# Patient Record
Sex: Female | Born: 1935 | Race: White | Hispanic: No | Smoking: Former smoker
Health system: Southern US, Community
[De-identification: ages and names within clinical notes are randomized; demographics above are authoritative.]

## PROBLEM LIST (undated history)

## (undated) HISTORY — PX: CATARACT EXTRACTION, BILATERAL: SHX1313

## (undated) HISTORY — PX: ABDOMINAL HYSTERECTOMY: SHX81

---

## 2014-10-04 DIAGNOSIS — I10 Essential (primary) hypertension: Secondary | ICD-10-CM | POA: Insufficient documentation

## 2014-10-04 DIAGNOSIS — H903 Sensorineural hearing loss, bilateral: Secondary | ICD-10-CM | POA: Insufficient documentation

## 2016-05-13 DIAGNOSIS — H34831 Tributary (branch) retinal vein occlusion, right eye, with macular edema: Secondary | ICD-10-CM | POA: Insufficient documentation

## 2017-01-06 DIAGNOSIS — H35351 Cystoid macular degeneration, right eye: Secondary | ICD-10-CM | POA: Insufficient documentation

## 2017-04-09 DIAGNOSIS — N183 Chronic kidney disease, stage 3 unspecified: Secondary | ICD-10-CM | POA: Insufficient documentation

## 2017-04-26 ENCOUNTER — Ambulatory Visit
Admission: RE | Admit: 2017-04-26 | Discharge: 2017-04-26 | Disposition: A | Discharge status: Home / Self Care | Payer: Medicare Other | Source: Ambulatory Visit | Attending: Oncology | Admitting: Oncology

## 2017-04-26 ENCOUNTER — Inpatient Hospital Stay: Payer: Medicare Other | Attending: Oncology | Admitting: Oncology

## 2017-04-26 ENCOUNTER — Other Ambulatory Visit: Payer: Self-pay

## 2017-04-26 ENCOUNTER — Inpatient Hospital Stay: Payer: Medicare Other

## 2017-04-26 ENCOUNTER — Encounter: Payer: Self-pay | Admitting: Oncology

## 2017-04-26 VITALS — BP 111/71 | HR 66 | Temp 97.5°F | Resp 20 | Wt 141.8 lb

## 2017-04-26 DIAGNOSIS — Z79899 Other long term (current) drug therapy: Secondary | ICD-10-CM | POA: Insufficient documentation

## 2017-04-26 DIAGNOSIS — D751 Secondary polycythemia: Secondary | ICD-10-CM

## 2017-04-26 DIAGNOSIS — Z8701 Personal history of pneumonia (recurrent): Secondary | ICD-10-CM | POA: Diagnosis not present

## 2017-04-26 DIAGNOSIS — M8580 Other specified disorders of bone density and structure, unspecified site: Secondary | ICD-10-CM | POA: Insufficient documentation

## 2017-04-26 DIAGNOSIS — E119 Type 2 diabetes mellitus without complications: Secondary | ICD-10-CM | POA: Diagnosis not present

## 2017-04-26 DIAGNOSIS — Z87891 Personal history of nicotine dependence: Secondary | ICD-10-CM | POA: Insufficient documentation

## 2017-04-26 DIAGNOSIS — I10 Essential (primary) hypertension: Secondary | ICD-10-CM | POA: Insufficient documentation

## 2017-04-26 DIAGNOSIS — Z9842 Cataract extraction status, left eye: Secondary | ICD-10-CM | POA: Insufficient documentation

## 2017-04-26 DIAGNOSIS — I700 Atherosclerosis of aorta: Secondary | ICD-10-CM | POA: Insufficient documentation

## 2017-04-26 DIAGNOSIS — R3129 Other microscopic hematuria: Secondary | ICD-10-CM | POA: Diagnosis not present

## 2017-04-26 LAB — CBC WITH DIFFERENTIAL/PLATELET
Basophils Absolute: 0.1 10*3/uL (ref 0–0.1)
Basophils Relative: 1 %
EOS ABS: 0.3 10*3/uL (ref 0–0.7)
EOS PCT: 3 %
HCT: 55.8 % — ABNORMAL HIGH (ref 35.0–47.0)
HEMOGLOBIN: 18.8 g/dL — AB (ref 12.0–16.0)
LYMPHS ABS: 2.1 10*3/uL (ref 1.0–3.6)
Lymphocytes Relative: 25 %
MCH: 32.7 pg (ref 26.0–34.0)
MCHC: 33.6 g/dL (ref 32.0–36.0)
MCV: 97.5 fL (ref 80.0–100.0)
MONO ABS: 0.5 10*3/uL (ref 0.2–0.9)
Monocytes Relative: 6 %
NEUTROS PCT: 65 %
Neutro Abs: 5.5 10*3/uL (ref 1.4–6.5)
PLATELETS: 245 10*3/uL (ref 150–440)
RBC: 5.73 MIL/uL — ABNORMAL HIGH (ref 3.80–5.20)
RDW: 13 % (ref 11.5–14.5)
WBC: 8.5 10*3/uL (ref 3.6–11.0)

## 2017-04-26 LAB — URINALYSIS, COMPLETE (UACMP) WITH MICROSCOPIC
Bilirubin Urine: NEGATIVE
GLUCOSE, UA: NEGATIVE mg/dL
Ketones, ur: NEGATIVE mg/dL
NITRITE: NEGATIVE
PH: 5.5 (ref 5.0–8.0)
Protein, ur: NEGATIVE mg/dL
SPECIFIC GRAVITY, URINE: 1.015 (ref 1.005–1.030)

## 2017-04-26 NOTE — Progress Notes (Signed)
Hematology/Oncology Consult note [facility] Telephone:([phone] [phone]  Patient Care Team: Orene Desanctis, MD as PCP - General (Pediatrics)   Name of the patient: Kelsey Russell  [id number]  08/25/1922    Reason for referral- high hemoglobin   Referring physician- Dr. Richardine Service  Date of visit: 04/26/17   History of presenting illness-patient-year-old female with a past medical history significant for hypertension diabetes, chronic issues who recently underwent CBC which showed white count of 8.3, H&H of 18.3/53.8 and a platelet count of 239.  CBC prior to that in 2016 showed a hemoglobin of 14.  We do not have any CBCs between 2016-2019 for comparison.  BMP in January 2019 was significant for creatinine of 1.7.  Liver function tests were normal  Patient lives with her daughter and is independent of her ADLs.  Daughter reports that patient has been feeling more fatigued and her appetite has gone down over the last 1 month.  She sleeps about 15-16 hours a day.  Reports no problems sleeping and she does wake up in the morning feeling refreshed.  She did smoke in the past but quit smoking in 1968.  She has 2-3 drinks every day  ECOG PS- 1  Pain scale- 0   Review of systems- Review of Systems  Constitutional: Negative for chills, fever, malaise/fatigue and weight loss.  HENT: Negative for congestion, ear discharge and nosebleeds.   Eyes: Negative for blurred vision.  Respiratory: Negative for cough, hemoptysis, sputum production, shortness of breath and wheezing.   Cardiovascular: Negative for chest pain, palpitations, orthopnea and claudication.  Gastrointestinal: Negative for abdominal pain, blood in stool, constipation, diarrhea, heartburn, melena, nausea and vomiting.  Genitourinary: Negative for dysuria, flank pain, frequency, hematuria and urgency.  Musculoskeletal: Negative for back pain, joint pain and myalgias.  Skin: Negative for rash.    Neurological: Negative for dizziness, tingling, focal weakness, seizures, weakness and headaches.  Endo/Heme/Allergies: Does not bruise/bleed easily.  Psychiatric/Behavioral: Negative for depression and suicidal ideas. The patient does not have insomnia.     Allergies  Allergen Reactions  . Levaquin [Levofloxacin In D5w]   . Statins     There are no active problems to display for this patient.    History reviewed. No pertinent past medical history.   Past Surgical History:  Procedure Laterality Date  . ABDOMINAL HYSTERECTOMY    . CATARACT EXTRACTION, BILATERAL      Social History   Socioeconomic History  . Marital status: Widowed    Spouse name: Not on file  . Number of children: Not on file  . Years of education: Not on file  . Highest education level: Not on file  Social Needs  . Financial resource strain: Not on file  . Food insecurity - worry: Not on file  . Food insecurity - inability: Not on file  . Transportation needs - medical: Not on file  . Transportation needs - non-medical: Not on file  Occupational History  . Not on file  Tobacco Use  . Smoking status: Former Smoker    Last attempt to quit: 04/26/1966    Years since quitting: 51.0  . Smokeless tobacco: Never Used  Substance and Sexual Activity  . Alcohol use: Yes    Alcohol/week: 10.8 oz    Types: 18 Shots of liquor per week    Comment: 2-3 shots scotch daily  . Drug use: Not on file  . Sexual activity: Not on file  Other Topics Concern  . Not on  file  Social History Narrative  . Not on file     Family History  Problem Relation Age of Onset  . Cancer Sister   . Cancer Brother   . Cancer Sister   . Cancer Son   . Cancer Brother      Current Outpatient Medications:  .  aspirin EC 81 MG tablet, Take 81 mg by mouth daily., Disp: , Rfl:  .  hydrochlorothiazide (HYDRODIURIL) 25 MG tablet, Take 25 mg by mouth daily., Disp: , Rfl:  .  irbesartan (AVAPRO) 300 MG tablet, Take 150 mg by mouth  daily., Disp: , Rfl:  .  mupirocin ointment (BACTROBAN) 2 %, Apply 1 application topically 2 (two) times daily., Disp: , Rfl:  .  naproxen sodium (ALEVE) 220 MG tablet, Take 220 mg by mouth daily as needed., Disp: , Rfl:    Physical exam:  Vitals:   04/26/17 1113  BP: 111/71  Pulse: 66  Resp: 20  Temp: (!) 97.5 F (36.4 C)  TempSrc: Tympanic  Weight: 141 lb 12.1 oz (64.3 kg)  She is saturating 97% on room air   Physical Exam  Constitutional: She is oriented to person, place, and time.  Frail elderly female in no acute distress  HENT:  Head: Normocephalic and atraumatic.  Eyes: EOM are normal. Pupils are equal, round, and reactive to light.  Neck: Normal range of motion.  Cardiovascular: Normal rate, regular rhythm and normal heart sounds.  Pulmonary/Chest: Effort normal and breath sounds normal.  Abdominal: Soft. Bowel sounds are normal.  No palpable splenomegaly  Neurological: She is alert and oriented to person, place, and time.  Skin: Skin is warm and dry.     Assessment and plan- Patient is a 84 y.o. female referred for polycythemia  Discussed primary polycythemia vera versus secondary polycythemia with patient and her daughter.  I will check CBC with differential, EPO level, Jak 2 mutation testing, chest x-ray and urinalysis today.  I will see the patient back in 1 week's time to discuss the results of her blood work.  If she has evidence of polycythemia vera-she will need phlebotomy to keep her hematocrit less than 45 along with hydroxyurea.  If she has secondary polycythemia we could consider watchful waiting and considering phlebotomy if her hematocrit is greater than 55.   Thank you for this kind referral and the opportunity to participate in the care of this patient   Visit Diagnosis 1. Polycythemia     Dr. Owens SharkArchana Cynithia Hakimi, MD, MPH [facility] Pager- [phone] 04/26/2017  11:47 AM

## 2017-04-27 LAB — ERYTHROPOIETIN: Erythropoietin: 4.1 m[IU]/mL (ref 2.6–18.5)

## 2017-05-03 ENCOUNTER — Inpatient Hospital Stay: Payer: Medicare Other

## 2017-05-03 ENCOUNTER — Encounter: Payer: Self-pay | Admitting: Oncology

## 2017-05-03 ENCOUNTER — Inpatient Hospital Stay (HOSPITAL_BASED_OUTPATIENT_CLINIC_OR_DEPARTMENT_OTHER): Payer: Medicare Other | Admitting: Oncology

## 2017-05-03 VITALS — BP 102/67 | HR 60 | Resp 18

## 2017-05-03 DIAGNOSIS — M8580 Other specified disorders of bone density and structure, unspecified site: Secondary | ICD-10-CM | POA: Diagnosis not present

## 2017-05-03 DIAGNOSIS — Z87891 Personal history of nicotine dependence: Secondary | ICD-10-CM | POA: Diagnosis not present

## 2017-05-03 DIAGNOSIS — Z79899 Other long term (current) drug therapy: Secondary | ICD-10-CM

## 2017-05-03 DIAGNOSIS — D751 Secondary polycythemia: Secondary | ICD-10-CM | POA: Diagnosis not present

## 2017-05-03 LAB — JAK2 GENOTYPR

## 2017-05-03 MED ORDER — SODIUM CHLORIDE 0.9 % IV SOLN
INTRAVENOUS | Status: DC
  Administered 2017-05-03: 12:00:00 via INTRAVENOUS
  Filled 2017-05-03: qty 1000

## 2017-05-03 NOTE — Progress Notes (Signed)
Hematology/Oncology Consult note [facility]  Telephone:([phone]) [phone] [phone]  Patient Care Team: Orene DesanctisBehling, Karen, MD as PCP - General (Pediatrics)   Name of the patient: Kelsey Russell  [id number]  04/06/1922   Date of visit: 05/03/17  Diagnosis- polycythemia- primary versus secondary (work up pending)  Automotive engineerChief complaint/ Reason for visit- discuss results of bloodwork  Heme/Onc history: patient-year-old female with a past medical history significant for hypertension diabetes, chronic issues who recently underwent CBC which showed white count of 8.3, H&H of 18.3/53.8 and a platelet count of 239.  CBC prior to that in 2016 showed a hemoglobin of 14.  We do not have any CBCs between 2016-2019 for comparison.  BMP in January 2019 was significant for creatinine of 1.7.  Liver function tests were normal  Patient lives with her daughter and is independent of her ADLs.  Daughter reports that patient has been feeling more fatigued and her appetite has gone down over the last 1 month.  She sleeps about 15-16 hours a day.  Reports no problems sleeping and she does wake up in the morning feeling refreshed.  She did smoke in the past but quit smoking in 1968.  She has 2-3 drinks every day  CBC from 04/26/2090 right count of 8.5, H&H of 18.8/55.8 with a platelet count of 245.  EPO levels were low normal at 4.1.  Jak 2 mutation testing is currently pending.  Urinalysis showed microscopic hematuria. CXR showed no chronic lung disease   Interval history- feels well. Denies any chest pain, stroke like symptoms or fatigue  ECOG PS- 1-2 Pain scale- 0   Review of systems- Review of Systems  Constitutional: Positive for malaise/fatigue. Negative for chills, fever and weight loss.  HENT: Negative for congestion, ear discharge and nosebleeds.   Eyes: Negative for blurred vision.  Respiratory: Negative for cough, hemoptysis, sputum production, shortness of breath and  wheezing.   Cardiovascular: Negative for chest pain, palpitations, orthopnea and claudication.  Gastrointestinal: Negative for abdominal pain, blood in stool, constipation, diarrhea, heartburn, melena, nausea and vomiting.  Genitourinary: Negative for dysuria, flank pain, frequency, hematuria and urgency.  Musculoskeletal: Negative for back pain, joint pain and myalgias.  Skin: Negative for rash.  Neurological: Negative for dizziness, tingling, focal weakness, seizures, weakness and headaches.  Endo/Heme/Allergies: Does not bruise/bleed easily.  Psychiatric/Behavioral: Negative for depression and suicidal ideas. The patient does not have insomnia.       Allergies  Allergen Reactions  . Levaquin [Levofloxacin In D5w]   . Statins      History reviewed. No pertinent past medical history.   Past Surgical History:  Procedure Laterality Date  . ABDOMINAL HYSTERECTOMY    . CATARACT EXTRACTION, BILATERAL      Social History   Socioeconomic History  . Marital status: Widowed    Spouse name: Not on file  . Number of children: Not on file  . Years of education: Not on file  . Highest education level: Not on file  Social Needs  . Financial resource strain: Not on file  . Food insecurity - worry: Not on file  . Food insecurity - inability: Not on file  . Transportation needs - medical: Not on file  . Transportation needs - non-medical: Not on file  Occupational History  . Not on file  Tobacco Use  . Smoking status: Former Smoker    Last attempt to quit: 04/26/1966    Years since quitting: 51.0  . Smokeless tobacco: Never Used  Substance and Sexual Activity  . Alcohol use: Yes    Alcohol/week: 10.8 oz    Types: 18 Shots of liquor per week    Comment: 2-3 shots scotch daily  . Drug use: Not on file  . Sexual activity: Not on file  Other Topics Concern  . Not on file  Social History Narrative  . Not on file    Family History  Problem Relation Age of Onset  . Cancer  Sister   . Cancer Brother   . Cancer Sister   . Cancer Son   . Cancer Brother      Current Outpatient Medications:  .  aspirin EC 81 MG tablet, Take 81 mg by mouth daily., Disp: , Rfl:  .  hydrochlorothiazide (HYDRODIURIL) 25 MG tablet, Take 25 mg by mouth daily., Disp: , Rfl:  .  irbesartan (AVAPRO) 300 MG tablet, Take 150 mg by mouth daily., Disp: , Rfl:  .  mupirocin ointment (BACTROBAN) 2 %, Apply 1 application topically 2 (two) times daily., Disp: , Rfl:  .  naproxen sodium (ALEVE) 220 MG tablet, Take 220 mg by mouth daily as needed., Disp: , Rfl:  No current facility-administered medications for this visit.   Facility-Administered Medications Ordered in Other Visits:  .  0.9 %  sodium chloride infusion, , Intravenous, Weekly, Creig Hines, MD  Physical exam:  Vitals:   05/03/17 1113  BP: (!) (P) 111/57  Pulse: (P) 71  Resp: (P) 18  Temp: (P) 98 F (36.7 C)  TempSrc: (P) Tympanic  Weight: (P) 142 lb (64.4 kg)   Physical Exam  Constitutional: She is oriented to person, place, and time.  Frail elderly female in no acute distress  HENT:  Head: Normocephalic and atraumatic.  Eyes: EOM are normal. Pupils are equal, round, and reactive to light.  Neck: Normal range of motion.  Cardiovascular: Normal rate, regular rhythm and normal heart sounds.  Pulmonary/Chest: Effort normal and breath sounds normal.  Abdominal: Soft. Bowel sounds are normal.  Neurological: She is alert and oriented to person, place, and time.  Skin: Skin is warm and dry.     No flowsheet data found. CBC Latest Ref Rng & Units 04/26/2017  WBC 3.6 - 11.0 K/uL 8.5  Hemoglobin 12.0 - 16.0 g/dL 18.8(H)  Hematocrit 35.0 - 47.0 % 55.8(H)  Platelets 150 - 440 K/uL 245    No images are attached to the encounter.  Dg Chest 2 View  Result Date: 04/26/2017 CLINICAL DATA:  Polycythemia and former smoker with history of prior pneumonia and bronchitis. EXAM: CHEST  2 VIEW COMPARISON:  None. FINDINGS: The  heart size is normal. The thoracic aorta demonstrates atherosclerosis and tortuosity. There is some elevation of the right hemidiaphragm. There is no evidence of pulmonary edema, consolidation, pneumothorax, nodule or pleural fluid. The bony thorax shows osteopenia and degenerative disc disease throughout the thoracic spine. IMPRESSION: No significant chronic lung disease or acute findings. Thoracic aortic atherosclerosis present with tortuosity of the thoracic aorta. Electronically Signed   By: Irish Lack M.D.   On: 04/26/2017 13:46     Assessment and plan- Patient is a 84 y.o. female referred for polycthemia  JAK2 mutation testing is still pending. Low normal EPO levels and a normal hb in 2016 is concerning for primary process such as polycythemia vera. UA did show microscopic hematuria. Malignancies such as RCC can cause polycythemia although EPO levele should be high in these cases. I will obtain CT abdomen pelvis without contrast (given  abnormal creatinine) to r/o any malignancy  If this is primary P vera- goal Hct would be less than 45. For secondary polycythemia I would aim for a higher hematocrit between 55-60 especially given her age  I will proceed with 250 cc of phlebotomy today (replace with 250 cc normal saline). She will get another phlebotomy in 2 weeks. I will see her in 4 weeks time for phlebotomy #3. If her JAK2 mutation comes back positive, I will see her sooner to discuss hydrea  I also discussed focusing on her QOL and symptoms (she is asymptomatic at this time) instead of scheduled phlebotomies given her age. She would like to proceed with phlebotomy for now.   Visit Diagnosis 1. Polycythemia      Dr. Owens Shark, MD, MPH [facility] at [facility] Pager- [phone] 05/03/2017 12:21 PM

## 2017-05-06 ENCOUNTER — Other Ambulatory Visit: Payer: Self-pay | Admitting: *Deleted

## 2017-05-06 DIAGNOSIS — D751 Secondary polycythemia: Secondary | ICD-10-CM

## 2017-05-07 ENCOUNTER — Other Ambulatory Visit: Payer: Self-pay | Admitting: *Deleted

## 2017-05-07 DIAGNOSIS — D751 Secondary polycythemia: Secondary | ICD-10-CM

## 2017-05-10 ENCOUNTER — Encounter: Payer: Self-pay | Admitting: Pediatrics

## 2017-05-11 ENCOUNTER — Encounter: Payer: Self-pay | Admitting: *Deleted

## 2017-05-17 ENCOUNTER — Other Ambulatory Visit: Payer: Medicare Other

## 2017-05-26 ENCOUNTER — Other Ambulatory Visit: Payer: Medicare Other

## 2017-05-31 ENCOUNTER — Ambulatory Visit: Payer: Medicare Other | Admitting: Oncology

## 2017-05-31 ENCOUNTER — Other Ambulatory Visit: Payer: Medicare Other

## 2018-06-01 DIAGNOSIS — F0390 Unspecified dementia without behavioral disturbance: Secondary | ICD-10-CM | POA: Insufficient documentation

## 2018-06-08 ENCOUNTER — Telehealth: Payer: Self-pay | Admitting: Primary Care

## 2018-06-08 NOTE — Telephone Encounter (Signed)
Called to set up community palliative medicine  visit for ongoing care. Message left.

## 2018-06-08 NOTE — Telephone Encounter (Signed)
T/c to MD to establish ongoing palliative care consultation. Office advised to continue to see patient in community prn.

## 2018-07-13 ENCOUNTER — Telehealth: Payer: Self-pay | Admitting: Primary Care

## 2018-07-14 ENCOUNTER — Other Ambulatory Visit: Payer: Medicare Other | Admitting: Primary Care

## 2018-07-14 ENCOUNTER — Other Ambulatory Visit: Payer: Self-pay

## 2018-07-15 ENCOUNTER — Other Ambulatory Visit: Payer: Self-pay

## 2018-07-15 ENCOUNTER — Other Ambulatory Visit: Payer: Medicare Other | Admitting: Primary Care

## 2018-07-15 DIAGNOSIS — Z515 Encounter for palliative care: Secondary | ICD-10-CM

## 2018-07-15 DIAGNOSIS — H903 Sensorineural hearing loss, bilateral: Secondary | ICD-10-CM

## 2018-07-15 DIAGNOSIS — I10 Essential (primary) hypertension: Secondary | ICD-10-CM

## 2018-07-15 DIAGNOSIS — F0390 Unspecified dementia without behavioral disturbance: Secondary | ICD-10-CM

## 2018-07-15 NOTE — Progress Notes (Signed)
[facility] Pall[facility] Care Consult Note Telephone: [phone]  Fax: [phone]  TELEHEALTH VISIT STATEMENT Due to the COVID-19 crisis, this visit was done via telemedicine from my office and it was initiated and consent by this patient and or family.   PATIENT NAME: Kelsey Russell DOB: February 22, 1923 MRN: [id number]  PRIMARY CARE PROVIDER:   Barbaraann Boys, MD  REFERRING PROVIDER:  Barbaraann Russell, [address] [place], [place] [address]  RESPONSIBLE PARTY:  Extended Emergency Contact Information Primary Emergency Contact: Kelsey, Russell Mobile Russell: [phone] Relation: Daughter Secondary Emergency Contact: Kelsey Russell: [phone] Relation: Daughter  Palliative Medicine was asked to consult on this case by Dr. Janene Russell. This is a follow up visit. Patient and daughter Kelsey Russell were present by telemedicine visit.   ASSESSMENT and RECOMMENDATIONS:   1. Goals of care: Patient has DNR. Will complete MOST form.Daughter and POA Kelsey Russell states she is not sure what to do if her mom passes away in her sleep. We discussed the MOST form and how this goes into more specific medical choices, and how EMS can take the completed MOST as a care order. Kelsey Russell is much relieved to know this exists. Will send a MOST form to her by mail for her records. Will f/u with [facility] when able to meet in person for co signature.  2. Caregiving: No needs voiced at this time. Kelsey Russell is primary care giver and states they are doing well. They are both confined during the Covid epidemic, and go out weekly for [employer] drive through.   3. Life Review: Patient told me about packing up her things at age 45 and moving to [place] to work in [place]. She met her husband there. She was 11th of 12 children, grew up on a farm, and is the only one remaining of her siblings and siblings in Sports [employer]. She lives with her daughter and a small dog she enjoys. She states she  is a gypsy at heart and out lines her "three golden rules"  for living. She denies any concerns with medications or symptoms at this time.  Palliative care will continue to follow for goals of care clarification and f/u in 2 months or prn.  I spent 40 minutes providing this consultation,  from 1045 to 1125. More than 50% of the time in this consultation was spent coordinating communication.   HISTORY OF PRESENT ILLNESS:  Kelsey Russell is a 85 y.o. year old female with multiple medical problems including Dementia, Insomnia, CKD III, Macular degeneration, HTN, H/o DM, now controlled. Palliative Care was asked to help address goals of care.   CODE STATUS: DNR  PPS: 40% HOSPICE ELIGIBILITY/DIAGNOSIS: TBD  PAST MEDICAL HISTORY: Dementia, Insomnia, CKD III, Macular degeneration, HTN, H/o DM, now controlled.  SOCIAL HX:  Social History   Tobacco Use  . Smoking status: Former Smoker    Last attempt to quit: 04/26/1966    Years since quitting: 52.2  . Smokeless tobacco: Never Used  Substance Use Topics  . Alcohol use: Yes    Alcohol/week: 18.0 standard drinks    Types: 18 Shots of liquor per week    Comment: 2-3 shots scotch daily    ALLERGIES:  Allergies  Allergen Reactions  . Levaquin [Levofloxacin In D5w]   . Statins      PERTINENT MEDICATIONS:  Outpatient Encounter Medications as of 07/15/2018  Medication Sig  . aspirin EC 81 MG tablet Take 81 mg by mouth daily.  . hydrochlorothiazide (HYDRODIURIL) 25  MG tablet Take 25 mg by mouth daily.  . irbesartan (AVAPRO) 300 MG tablet Take 150 mg by mouth daily.  . mupirocin ointment (BACTROBAN) 2 % Apply 1 application topically 2 (two) times daily.  . naproxen sodium (ALEVE) 220 MG tablet Take 220 mg by mouth daily as needed.   No facility-administered encounter medications on file as of 07/15/2018.     ROS AND PHYSICAL EXAM: Reported by Pt and pcg, and viewed via telemedicine visit:  General: NAD, WNWD appearing, good appetite.  Endorses insomnia, drinks alcohol at HS to sleep. HEENT: endorses HOH, h/o macular degeneration. Cardiovascular: no chest pain endorsed Pulmonary: no cough, no SOB at rest Abdomen: appetite good, intake good Extremities: joint pain from OA, ambulates in home without assistive devices.  Skin: no rashes or wounds on gross exam Neurological: Alert and oriented x 2, some forgetfulness, able to participate in Zoom telemedicine call. HOH, had to turn up sound and daughter also repeated things. Affect appropriate and pleasant.   Kelsey Skeeters DNP, AGPCNP-BC

## 2018-10-01 ENCOUNTER — Other Ambulatory Visit: Payer: Self-pay | Admitting: Family Medicine

## 2018-10-01 DIAGNOSIS — Z20822 Contact with and (suspected) exposure to covid-19: Secondary | ICD-10-CM

## 2018-10-01 NOTE — Progress Notes (Signed)
[place] 

## 2018-10-03 ENCOUNTER — Other Ambulatory Visit: Payer: Self-pay

## 2018-10-03 ENCOUNTER — Other Ambulatory Visit: Payer: Medicare Other | Admitting: Primary Care

## 2018-10-03 DIAGNOSIS — Z515 Encounter for palliative care: Secondary | ICD-10-CM

## 2018-10-03 NOTE — Progress Notes (Signed)
[facility] Palliative Care Consult Note Telephone: [phone]  Fax: [phone]  TELEHEALTH VISIT STATEMENT Due to the COVID-19 crisis, this visit was done via telemedicine from my office. It was initiated and consented to by this patient and/or family.  PATIENT NAME: Kelsey Russell DOB: 27-Jul-1922 MRN: [id number]  PRIMARY CARE PROVIDER:   Barbaraann Russell, [place]  REFERRING PROVIDER:  Barbaraann Russell, [address] [place],  [place] [address] [phone]  RESPONSIBLE PARTY:   Extended Emergency Contact Information Primary Emergency Contact: Kelsey, Russell Mobile Phone: [phone] Relation: Daughter Secondary Emergency Contact: Kelsey Russell Phone: [phone] Relation: Daughter  Palliative Care was asked to follow this patient by consultation request of Kelsey Boys, MD. This is a follow up visit.  ASSESSMENT AND RECOMMENDATIONS:   1. Goals of Care: Maximize quality of life and symptom management.  2. Symptom Management:   Patient states she feels well and is happy with her life. Lives with retired daughter. They enjoy spending time together enjoying their pets Kelsey Russell, [place], activities, and go out occasionally. She denies pain or constipation, and states she sleeps and eats well.   3. Family /Caregiver/Community Supports:  Lives with daughter in [place], has another daughter in the area, two sons in [place] and a daughter in [place]. No caregiver issues currently.  4. Cognitive / Functional decline: FAST Score 6c, needs help with adls and ambulation but is continent and conversant. Getting weaker and sleeping more per daughter.   5. Advanced Care Directive: Patient has DNR from previous palliative visit and MOST we had created this spring. Both were scanned and uploaded into [facility] today.  6. Follow up Palliative Care Visit: Palliative care will continue to follow for goals of care clarification and symptom  management. Return 3 months or prn.  I spent 60 minutes providing this consultation,  from  to 1330. More than 50% of the time in this consultation was spent coordinating communication.   HISTORY OF PRESENT ILLNESS:  Kelsey Russell is a 85 y.o. year old female with multiple medical problems including 1430. Palliative Care was asked to help address goals of care.   CODE STATUS: DNR, MOST with DNR, comfort measures, determined 1 week  trial use of antibiotics and fluids, no feeding tube.  PPS: 50% HOSPICE ELIGIBILITY/DIAGNOSIS: TBD  PAST MEDICAL HISTORY: DM2, HTN, Alzheimer's Dementia, HLD, CKD III,   SOCIAL HX:  Social History   Tobacco Use  . Smoking status: Former Smoker    Quit date: 04/26/1966    Years since quitting: 52.4  . Smokeless tobacco: Never Used  Substance Use Topics  . Alcohol use: Yes    Alcohol/week: 18.0 standard drinks    Types: 18 Shots of liquor per week    Comment: 2-3 shots scotch daily    ALLERGIES:  Allergies  Allergen Reactions  . Levaquin [Levofloxacin In D5w]   . Statins      PERTINENT MEDICATIONS:  Outpatient Encounter Medications as of 10/03/2018  Medication Sig  . aspirin EC 81 MG tablet Take 81 mg by mouth daily.  . hydrochlorothiazide (HYDRODIURIL) 25 MG tablet Take 25 mg by mouth daily.  . irbesartan (AVAPRO) 300 MG tablet Take 150 mg by mouth daily.  . mupirocin ointment (BACTROBAN) 2 % Apply 1 application topically 2 (two) times daily.  . naproxen sodium (ALEVE) 220 MG tablet Take 220 mg by mouth daily as needed.   No facility-administered encounter medications on Russell as of 10/03/2018.  PHYSICAL EXAM/ROS:   71= HR, 18= RR PO2 =90-92% Room air Current and past weights: current unavailable, 05/2018 = 145 lb General: NAD, frail appearing, WNWD, Cardiovascular: no chest pain reported, no LE Edema, S1S2 Pulmonary: endorses PND, + dry cough, no SOB at rest, Denies taxing DOE,  PO 2=90%, Lungs with rales in bil bases Abdomen:  appetite good in AM, less during lunch and dinner,  denies constipation, continent of bowel  GU: denies dysuria, continent of urine MSK:  no joint deformities, ambulates with help and can go out with daughter in car Skin: no rashes or wounds reported Neurological: Kelsey CroftWeakness,dementia with much repeating, FAST Score of 6 C. sleeps 8 pm to 10 am,  sundowning begins around 5 pm, has alcohol at 4 pm.  Living comfortably with daughter who is retired as a Financial controllerflight attendant.   Kelsey FileKathryn M Ramar Nobrega DNP AGPCNP-BC

## 2018-10-08 LAB — NOVEL CORONAVIRUS, NAA: SARS-CoV-2, NAA: NOT DETECTED

## 2018-10-11 ENCOUNTER — Other Ambulatory Visit: Payer: Self-pay

## 2018-10-11 ENCOUNTER — Encounter: Payer: Self-pay | Admitting: Emergency Medicine

## 2018-10-11 ENCOUNTER — Emergency Department
Admission: EM | Admit: 2018-10-11 | Discharge: 2018-10-11 | Disposition: A | Discharge status: Home / Self Care | Payer: Medicare Other | Attending: Emergency Medicine | Admitting: Emergency Medicine

## 2018-10-11 ENCOUNTER — Telehealth: Payer: Self-pay | Admitting: Primary Care

## 2018-10-11 ENCOUNTER — Emergency Department: Payer: Medicare Other

## 2018-10-11 DIAGNOSIS — E860 Dehydration: Secondary | ICD-10-CM | POA: Insufficient documentation

## 2018-10-11 DIAGNOSIS — Z87891 Personal history of nicotine dependence: Secondary | ICD-10-CM | POA: Insufficient documentation

## 2018-10-11 DIAGNOSIS — J40 Bronchitis, not specified as acute or chronic: Secondary | ICD-10-CM | POA: Insufficient documentation

## 2018-10-11 DIAGNOSIS — R05 Cough: Secondary | ICD-10-CM | POA: Diagnosis present

## 2018-10-11 LAB — CBC
HCT: 52 % — ABNORMAL HIGH (ref 36.0–46.0)
Hemoglobin: 16 g/dL — ABNORMAL HIGH (ref 12.0–15.0)
MCH: 30.8 pg (ref 26.0–34.0)
MCHC: 30.8 g/dL (ref 30.0–36.0)
MCV: 100 fL (ref 80.0–100.0)
Platelets: 268 10*3/uL (ref 150–400)
RBC: 5.2 MIL/uL — ABNORMAL HIGH (ref 3.87–5.11)
RDW: 13.6 % (ref 11.5–15.5)
WBC: 9.3 10*3/uL (ref 4.0–10.5)
nRBC: 0 % (ref 0.0–0.2)

## 2018-10-11 LAB — BASIC METABOLIC PANEL
Anion gap: 15 (ref 5–15)
BUN: 45 mg/dL — ABNORMAL HIGH (ref 8–23)
CO2: 22 mmol/L (ref 22–32)
Calcium: 8.9 mg/dL (ref 8.9–10.3)
Chloride: 102 mmol/L (ref 98–111)
Creatinine, Ser: 1.69 mg/dL — ABNORMAL HIGH (ref 0.44–1.00)
GFR calc Af Amer: 30 mL/min — ABNORMAL LOW (ref >60)
GFR calc non Af Amer: 26 mL/min — ABNORMAL LOW (ref >60)
Glucose, Bld: 108 mg/dL — ABNORMAL HIGH (ref 70–99)
Potassium: 4.4 mmol/L (ref 3.5–5.1)
Sodium: 139 mmol/L (ref 135–145)

## 2018-10-11 MED ORDER — SODIUM CHLORIDE 0.9 % IV BOLUS
1000.0000 mL | Freq: Once | INTRAVENOUS | Status: AC
  Administered 2018-10-11: 16:00:00 1000 mL via INTRAVENOUS

## 2018-10-11 MED ORDER — DEXAMETHASONE SODIUM PHOSPHATE 10 MG/ML IJ SOLN
6.0000 mg | Freq: Once | INTRAMUSCULAR | Status: AC
  Administered 2018-10-11: 6 mg via INTRAVENOUS
  Filled 2018-10-11: qty 1

## 2018-10-11 MED ORDER — SODIUM CHLORIDE 0.9 % IV SOLN
500.0000 mg | Freq: Once | INTRAVENOUS | Status: AC
  Administered 2018-10-11: 500 mg via INTRAVENOUS
  Filled 2018-10-11: qty 500

## 2018-10-11 MED ORDER — ALBUTEROL SULFATE HFA 108 (90 BASE) MCG/ACT IN AERS
2.0000 | INHALATION_SPRAY | Freq: Once | RESPIRATORY_TRACT | Status: DC
  Filled 2018-10-11: qty 6.7

## 2018-10-11 MED ORDER — DOXYCYCLINE MONOHYDRATE 50 MG PO TABS: 100.0000 mg | ORAL_TABLET | Freq: Two times a day (BID) | ORAL | 0 refills | Status: AC

## 2018-10-11 MED ORDER — SODIUM CHLORIDE 0.9 % IV SOLN
2.0000 g | Freq: Once | INTRAVENOUS | Status: AC
  Administered 2018-10-11: 2 g via INTRAVENOUS
  Filled 2018-10-11: qty 20

## 2018-10-11 MED ORDER — ALBUTEROL SULFATE HFA 108 (90 BASE) MCG/ACT IN AERS: 2.0000 | INHALATION_SPRAY | Freq: Four times a day (QID) | RESPIRATORY_TRACT | 0 refills | Status: DC | PRN

## 2018-10-11 NOTE — ED Notes (Signed)
ED Provider at bedside. 

## 2018-10-11 NOTE — Discharge Instructions (Addendum)
Try to drink as much fluid as you can.  Continue to eat regular meals.  Take the antibiotic as prescribed.  Can also use the inhaler every 4 hours as needed for cough or wheezing.  If you do need to take an antihistamine, take a half dose of Claritin.  Try to minimize this, as it could be contributing to her sedation.

## 2018-10-11 NOTE — ED Triage Notes (Signed)
Patient presents to the ED via EMS with "being in bed since Thursday".  Patient denies any pain.  EMS reports hearing "crackles" in patient's left lung.  Patient denies shortness of breath or chest pain.  Patient is hard of hearing.  Answers questions appropriately at this time.

## 2018-10-11 NOTE — ED Notes (Signed)
Pt alert.  Family with pt.   Iv fluids infusing.

## 2018-10-11 NOTE — Telephone Encounter (Signed)
T/c from daughter this am stating patient has continued to cough and has been lethargic and sleeping a lot. She has had little oral intake over past 4 days, and is rousable but then goes back to sleep. She thinks she may have gone to bathroom but this was not witnessed.   Cough has gone from dry intermittent to coarse and more frequent.  We discussed their goals of care RE end of life and if this was decline or something acute. We discussed how the hospital could assess her and give limited interventions such as fluids and antibiotics and be within their scope of treatment desires. She agreed that this would be want they wanted and if she declined, they would elect comfort care. She states a telemed visit with PCP today but I advised her to go to ED for fluids and workup labs and xray as intervention would be better earlier than later to enhance a good outcome. Daughter voices understanding and will call EMS to transport.

## 2018-10-11 NOTE — ED Provider Notes (Signed)
[facility] Emergency Department Provider Note  ____________________________________________   First MD Initiated Contact with Patient 10/11/18 1436     (approximate)  I have reviewed the triage vital signs and the nursing notes.   HISTORY  Chief Complaint Weakness    HPI Kelsey Russell is a 85 y.o. female currently on hospice with palliative care here with cough.  Patient reportedly has had an increasingly "raspy" cough for the last several days.  She is also had increased allergies and has been taking antihistamines.  Per report from the patient's daughter, who is her primary caregiver, she said more drowsiness and has slept for the last 48 hours or so.  She normally does sleep a lot at home, but this is abnormal for her.  She is had increased cough and decreased appetite.  She called the palliative care nurse who advised to come to the ER for fluids and possible antibiotics.  Patient does arrive with a most form, which states that patient would be open to limited trial of antibiotics, but would prefer no hospitalizations.  On my assessment, the patient is mildly confused which is baseline, but denies any complaints.  She denies any shortness of breath or chest pain.  She does not recall any fevers.  She states she is currently hungry.        History reviewed. No pertinent past medical history.  Patient Active Problem List   Diagnosis Date Noted  . Polycythemia 05/03/2017    Past Surgical History:  Procedure Laterality Date  . ABDOMINAL HYSTERECTOMY    . CATARACT EXTRACTION, BILATERAL      Prior to Admission medications   Medication Sig Start Date End Date Taking? Authorizing Provider  aspirin EC 81 MG tablet Take 81 mg by mouth daily.    [provider]  doxycycline (ADOXA) 50 MG tablet Take 2 tablets (100 mg total) by mouth 2 (two) times daily for 7 days. 10/11/18 10/18/18  Shaune PollackIsaacs, Strummer Canipe, MD  hydrochlorothiazide (HYDRODIURIL) 25 MG  tablet Take 25 mg by mouth daily.    [provider]  irbesartan (AVAPRO) 300 MG tablet Take 150 mg by mouth daily.    [provider]  mupirocin ointment (BACTROBAN) 2 % Apply 1 application topically 2 (two) times daily.    [provider]  naproxen sodium (ALEVE) 220 MG tablet Take 220 mg by mouth daily as needed.    [provider]    Allergies Levaquin [levofloxacin in d5w] and Statins  Family History  Problem Relation Age of Onset  . Cancer Sister   . Cancer Brother   . Cancer Sister   . Cancer Son   . Cancer Brother     Social History Social History   Tobacco Use  . Smoking status: Former Smoker    Quit date: 04/26/1966    Years since quitting: 52.4  . Smokeless tobacco: Never Used  Substance Use Topics  . Alcohol use: Yes    Alcohol/week: 18.0 standard drinks    Types: 18 Shots of liquor per week    Comment: 2-3 shots scotch daily  . Drug use: Not on file    Review of Systems  Review of Systems  Constitutional: Positive for fatigue. Negative for fever.  HENT: Negative for congestion and sore throat.   Eyes: Negative for visual disturbance.  Respiratory: Positive for shortness of breath. Negative for cough.   Cardiovascular: Negative for chest pain.  Gastrointestinal: Negative for abdominal pain, diarrhea, nausea and vomiting.  Genitourinary: Negative for flank pain.  Musculoskeletal: Negative for back pain and neck pain.  Skin: Negative for rash and wound.  Neurological: Positive for weakness.  All other systems reviewed and are negative.    ____________________________________________  PHYSICAL EXAM:      VITAL SIGNS: ED Triage Vitals  Enc Vitals Group     BP 10/11/18 1323 128/80     Pulse Rate 10/11/18 1323 86     Resp 10/11/18 1323 18     Temp 10/11/18 1323 98.8 F (37.1 C)     Temp Source 10/11/18 1323 Oral     SpO2 10/11/18 1323 100 %     Weight --      Height --      Head Circumference --      Peak Flow  --      Pain Score 10/11/18 1308 0     Pain Loc --      Pain Edu? --      Excl. in [place]? --      Physical Exam Vitals signs and nursing note reviewed.  Constitutional:      General: She is not in acute distress.    Appearance: She is well-developed.  HENT:     Head: Normocephalic and atraumatic.     Comments: Mildly dry mucous membranes Eyes:     Conjunctiva/sclera: Conjunctivae normal.  Neck:     Musculoskeletal: Neck supple.  Cardiovascular:     Rate and Rhythm: Normal rate and regular rhythm.     Heart sounds: Normal heart sounds. No murmur. No friction rub.  Pulmonary:     Effort: Pulmonary effort is normal. No respiratory distress.     Breath sounds: Normal breath sounds. No wheezing or rales.  Abdominal:     General: There is no distension.     Palpations: Abdomen is soft.     Tenderness: There is no abdominal tenderness.  Skin:    General: Skin is warm.     Capillary Refill: Capillary refill takes less than 2 seconds.  Neurological:     Mental Status: She is alert and oriented to person, place, and time.     Motor: No abnormal muscle tone.       ____________________________________________   LABS (all labs ordered are listed, but only abnormal results are displayed)  Labs Reviewed  BASIC METABOLIC PANEL - Abnormal; Notable for the following components:      Result Value   Glucose, Bld 108 (*)    BUN 45 (*)    Creatinine, Ser 1.69 (*)    GFR calc non Af Amer 26 (*)    GFR calc Af Amer 30 (*)    All other components within normal limits  CBC - Abnormal; Notable for the following components:   RBC 5.20 (*)    Hemoglobin 16.0 (*)    HCT 52.0 (*)    All other components within normal limits  URINALYSIS, COMPLETE (UACMP) WITH MICROSCOPIC    ____________________________________________  EKG: Normal sinus ryhthm, LAD, RBBB. VR 84, QRS 134, QTc 496. No ischemic changes compared to prior. ________________________________________  RADIOLOGY All imaging,  including plain films, CT scans, and ultrasounds, independently reviewed by me, and interpretations confirmed via formal radiology reads.  ED MD interpretation:   CXR: Clear, no focal findings  Official radiology report(s): Dg Chest 2 View  Result Date: 10/11/2018 CLINICAL DATA:  Cough and weakness EXAM: CHEST - 2 VIEW COMPARISON:  04/26/2017 FINDINGS: Cardiac shadows within normal limits. Aortic calcifications are noted.  Somewhat nodular density is noted in the right apex medially just lateral to tracheal shadow which appears to have been present previously likely related to tortuous vascularity. The possibility of an underlying mass could not be totally excluded however. The lungs are otherwise clear. No acute bony abnormality is noted. IMPRESSION: Nodular density in the medial right apex as described. This is likely vascular in nature although follow-up CT is recommended to assess its etiology. Electronically Signed   By: Alcide CleverMark  Lukens M.D.   On: 10/11/2018 15:43    ____________________________________________  PROCEDURES   Procedure(s) performed (including Critical Care):  Procedures  ____________________________________________  INITIAL IMPRESSION / MDM / ASSESSMENT AND PLAN / ED COURSE  As part of my medical decision making, I reviewed the following data within the electronic MEDICAL RECORD NUMBER Notes from prior ED visits and [place] Controlled Substance Database      *Eda Javier GlazierBelle Sangalang was evaluated in Emergency Department on 10/11/2018 for the symptoms described in the history of present illness. She was evaluated in the context of the global COVID-19 pandemic, which necessitated consideration that the patient might be at risk for infection with the SARS-CoV-2 virus that causes COVID-19. Institutional protocols and algorithms that pertain to the evaluation of patients at risk for COVID-19 are in a state of rapid change based on information released by regulatory bodies including the CDC  and federal and state organizations. These policies and algorithms were followed during the patient's care in the ED.  Some ED evaluations and interventions may be delayed as a result of limited staffing during the pandemic.*      Medical Decision Making: 85 yo F on palliative care here w/ cough.  On my exam, she is very well-appearing and satting well on room air.  She is speaking in full sentences and her baseline mental status per family.  Lab work does show significant dehydration with elevated BUN to creatinine ratio, and she has been given IV fluids.  Chest x-ray is clear.  However, given her reported cough and occasional wheezing, will treat for possible early pneumonia.  She is also given a dose of Decadron and albuterol as she is responded well to this in the past.  I had a long discussion with the patient's family and her.  The patient would like to avoid hospitalization which I think is reasonable both in terms of her reassuring labs and vitals, as well as her palliative care status.  Encouraged family to return with any concerns and to discuss with palliative care MD.  Patient feels much better after IV fluids here and is tolerating p.o.  ____________________________________________  FINAL CLINICAL IMPRESSION(S) / ED DIAGNOSES  Final diagnoses:  Bronchitis  Dehydration     MEDICATIONS GIVEN DURING THIS VISIT:  Medications  dexamethasone (DECADRON) injection 6 mg (has no administration in time range)  albuterol (VENTOLIN HFA) 108 (90 Base) MCG/ACT inhaler 2 puff (has no administration in time range)  sodium chloride 0.9 % bolus 1,000 mL (0 mLs Intravenous Stopped 10/11/18 1622)  cefTRIAXone (ROCEPHIN) 2 g in sodium chloride 0.9 % 100 mL IVPB (0 g Intravenous Stopped 10/11/18 1622)  azithromycin (ZITHROMAX) 500 mg in sodium chloride 0.9 % 250 mL IVPB (0 mg Intravenous Stopped 10/11/18 1658)     ED Discharge Orders         Ordered    doxycycline (ADOXA) 50 MG tablet  2 times daily      10/11/18 1725           Note:  This document was prepared using Dragon voice recognition software and may include unintentional dictation errors.   Shaune PollackIsaacs, Shykeem Resurreccion, MD 10/11/18 [phone]

## 2018-10-11 NOTE — ED Notes (Addendum)
Patient seems somewhat confused.  Does not know month and is now stating she is here because she needs shoes.  This RN called daughter who is outside and she states patient is confused at baseline.  Daughter screened and allowed back.  Daughter states patient has been sleeping a lot since Thursday.

## 2018-10-14 ENCOUNTER — Other Ambulatory Visit: Payer: Self-pay

## 2018-10-14 ENCOUNTER — Other Ambulatory Visit: Payer: Medicare Other | Admitting: Primary Care

## 2018-10-14 DIAGNOSIS — Z515 Encounter for palliative care: Secondary | ICD-10-CM

## 2018-10-14 NOTE — Progress Notes (Signed)
T/c to check on patient after ED visit. Daughter wanted brief call, states she's doing much better, eating and drinking well, oriented at her base line. She  is being Rx for bacterial bronchitis and was re hydrated in ED. She states no immediate needs but will call if she needs help. Will reschedule for routine palliative f/u in a month or so.

## 2018-10-24 NOTE — Telephone Encounter (Signed)
Opened in error

## 2018-10-26 ENCOUNTER — Other Ambulatory Visit: Payer: Medicare Other | Admitting: Primary Care

## 2018-10-26 ENCOUNTER — Other Ambulatory Visit: Payer: Self-pay

## 2018-10-26 DIAGNOSIS — Z515 Encounter for palliative care: Secondary | ICD-10-CM

## 2018-10-26 NOTE — Progress Notes (Signed)
[facility] Palliative Care Consult Note Telephone: [phone]  Fax: [phone]  TELEHEALTH VISIT STATEMENT Due to the COVID-19 crisis, this visit was done via telemedicine from my office. It was initiated and consented to by this patient and/or family.  PATIENT NAME: Kelsey Russell DOB: Nov 17, 1922 MRN: [id number]  PRIMARY CARE PROVIDER:   Barbaraann Boys, [place]  REFERRING PROVIDER:  Barbaraann Boys, [address] [place],  [place] [address] [phone]  RESPONSIBLE PARTY:   Extended Emergency Contact Information Primary Emergency Contact: Mykaela, [place] Mobile Phone: [phone] Relation: Daughter Secondary Emergency Contact: Sandy Phone: [phone] Relation: Daughter  Palliative Care was asked to follow this patient by consultation request of Barbaraann Boys, MD. This is a follow up visit.  ASSESSMENT AND RECOMMENDATIONS:   1. Goals of Care: Maximize quality of life and symptom management.  2. Symptom Management:   Confusion:  Some at baseline but is continuing or worsening especially in light of sleeping 20 hours/ day. She is not oriented to time or place for the most part. She can be oriented for a short time.  Cough: Continues some occasional cough attributed to allergy. She was treated for bronchitis on last ED trip but this has not changed with Rx. She has 1-2+ edema in LE most days.   Self Care: She is able to ambulate to bathroom with stand by or 1 person assistance .She is able to feed self with set up and some cuing.   3. Family /Caregiver/Community Supports: Lives with daughter who cared for her father and husband at end of life. She and pt live together and enjoy time doing things together, but now patient is losing function, mentally and physically. Daughter is assisted by neighbors on occasions. She asks today about hospice services as she was caring for her father and husband on hospice as  well.  4. Cognitive / Functional decline: Call from daughter that pt is not getting back to baseline after recent hospital stay. She was sleeping excessively 2 weeks ago and we sent to ED for assessment. She was slightly dehydrated and they Rx for bronchitis. She is more confused now than her recent baseline,  and continues sleeping around 20 hours / day. She does awaken to eat something once a day and then returns to bed. She becomes alert but is drowsy most of the time she's up.  5. Advanced Care Directive: DNR, MOST with DNR, comfort measures, determined 1 week  trial use of antibiotics and fluids, no feeding tube. She has had the above treatment recently and daughter does not want to return to ED for pursuing Rx, but wants to proceed with comfort and supportive care via hospice.   6. Follow up Palliative Care Visit: Palliative care will continue to follow for goals of care clarification and symptom management. Return 2-4 weeks or prn.  I spent 60 minutes providing this consultation,  from 0900 to 1000. More than 50% of the time in this consultation was spent coordinating communication.   HISTORY OF PRESENT ILLNESS:  Kelsey Russell is a 85 y.o. year old female with multiple medical problems including DM2, HTN, Alzheimer's Dementia, HLD, CKD III. Palliative Care was asked to help address goals of care.   CODE STATUS: DNR, MOST with DNR, comfort measures, determined 1 week  trial use of antibiotics and fluids, no feeding tube.  PPS: 40%, was at 50% a month ago. HOSPICE ELIGIBILITY/DIAGNOSIS: TBD  PAST MEDICAL HISTORY: No past medical history on  file.  SOCIAL HX:  Social History   Tobacco Use  . Smoking status: Former Smoker    Quit date: 04/26/1966    Years since quitting: 52.5  . Smokeless tobacco: Never Used  Substance Use Topics  . Alcohol use: Yes    Alcohol/week: 18.0 standard drinks    Types: 18 Shots of liquor per week    Comment: 2-3 shots scotch daily    ALLERGIES:   Allergies  Allergen Reactions  . Levaquin [Levofloxacin In D5w]   . Statins      PERTINENT MEDICATIONS:  Outpatient Encounter Medications as of 10/26/2018  Medication Sig  . albuterol (VENTOLIN HFA) 108 (90 Base) MCG/ACT inhaler Inhale 2 puffs into the lungs every 6 (six) hours as needed for wheezing or shortness of breath.  Marland Kitchen. aspirin EC 81 MG tablet Take 81 mg by mouth daily.  . hydrochlorothiazide (HYDRODIURIL) 25 MG tablet Take 25 mg by mouth daily.  . irbesartan (AVAPRO) 300 MG tablet Take 150 mg by mouth daily.  . mupirocin ointment (BACTROBAN) 2 % Apply 1 application topically 2 (two) times daily.  . naproxen sodium (ALEVE) 220 MG tablet Take 220 mg by mouth daily as needed.   No facility-administered encounter medications on file as of 10/26/2018.     PHYSICAL EXAM/ROS:   Current and past weights: 138 lb, weight in 04/2017= 142 lb General: NAD, frail  Cardiovascular: no chest pain reported, no edema reported Pulmonary: no cough, no increased SOB Abdomen: appetite fair to poor, endorses occ constipation, continent of bowel GU: denies dysuria, continent of urine MSK:  no joint deformities, ambulates with assistance Skin: no rashes or wounds reported Neurological: Weakness, confusion as to time and place  Marijo FileKathryn M Tilley Faeth DNP, AGPCNP-BC

## 2018-11-01 ENCOUNTER — Telehealth: Payer: Self-pay | Admitting: Primary Care

## 2018-11-01 ENCOUNTER — Other Ambulatory Visit: Payer: Medicare Other | Admitting: Primary Care

## 2018-11-01 ENCOUNTER — Other Ambulatory Visit: Payer: Self-pay

## 2018-11-01 DIAGNOSIS — Z515 Encounter for palliative care: Secondary | ICD-10-CM

## 2018-11-01 NOTE — Telephone Encounter (Signed)
Appt made for 1 pm today

## 2018-11-01 NOTE — Progress Notes (Signed)
[facility] Consult Note Telephone: [phone]  Fax: [phone]   PATIENT NAME: Kelsey Russell DOB: 06/09/1922 MRN: [id number]  PRIMARY CARE PROVIDER:   Orene DesanctisBehling, Karen, MD [phone]  REFERRING PROVIDER:  Orene DesanctisBehling, Karen, MD [address] [place] [id number] [phone]  RESPONSIBLE PARTY:   Extended Emergency Contact Information Primary Emergency Contact: Merrily PewClement,Nancy Jean Mobile Phone: [phone] Relation: Daughter Secondary Emergency Contact: [facility],RoseMarie Home Phone: [phone] Relation: Daughter  Palliative Care was asked to follow this patient by consultation request of Orene DesanctisBehling, Karen, MD. This is a follow up visit.  ASSESSMENT AND RECOMMENDATIONS:   1. Goals of Care: Maximize quality of life and symptom management. Discussed goals again for minimal intervention and focusing on quality of life at home.   2. Symptom Management:   Nutrition: Eating less but per family description is eating and drinking adequately. Weight is maintaining. No pedal edema. Encourage to hydrate.  Cough: Minimal, no pedal edema, has some rales in left lower lobe. Appears not to have gross aspiration but family realizes the risk. We reviewed good eating hygiene, eating slowly, hydration with meals, sitting up to table, not lying recumbent to eat or after eating.  Goals of Care: I was re assessing for hospice referral but feel she is very functional and not appropriate for hospice at this time. She is eating well now, maintaining her weight, ambulating independently, reading and visiting with family. She is in fragile health but does not meet prognosis of 6 months or less hospice provision.  3. Family /Caregiver/Community Supports: Lives with retired daughter who cared for her own husband, father and now her mother. I encouraged her to find someone to hire for respite help so she can take care of her own affairs, and get rest as  she is the 24/7 caregiver. Today with us is patient's son and his family, PCG's brother who lives in [place]. I also gave the resources of [facility] and [facility] for finding additional assistive services.  4. Cognitive / Functional decline:  After 2 weeks of decline she appears to be at her baseline, alert and oriented x 2. She is discussing a book she's reading on the [place]. She also was awake today at 8 am, ending her weeks of sleeping 20 + hours/ day. She is at her physical function baseline, able to ambulate without device and perform self care alds with help.   5. Advanced Care Directive: DNR, has MOST which I will upload today in [facility]. DNR, Comfort measures, trial of antibiotics and  trial of IV fluids both x 7 days, no feeding tube. She reiterated this was still her choice and we will again update in 3 months or prn.  6. Follow up Palliative Care Visit: Palliative care will continue to follow for goals of care clarification and symptom management. Return 6 weeks or prn.  I spent 60 minutes providing this consultation,  from 1330 to 1430. More than 50% of the time in this consultation was spent coordinating communication.   HISTORY OF PRESENT ILLNESS:  Kelsey Russell is a 85 y.o. year old female with multiple medical problems including Dementia, Insomnia, CKD III, Macular degeneration, HTN, H/o DM, now controlled. Palliative Care was asked to help address goals of care.   CODE STATUS: DNR, Comfort measures, trial of antibiotics and  trial of IV fluids both x 7 days, no feeding tube.  PPS: 40% HOSPICE ELIGIBILITY/DIAGNOSIS: TBD  PAST MEDICAL  HISTORY: No past medical history on file.  SOCIAL HX:  Social History   Tobacco Use  . Smoking status: Former Smoker    Quit date: 04/26/1966    Years since quitting: 52.5  . Smokeless tobacco: Never Used  Substance Use Topics  . Alcohol use: Yes    Alcohol/week: 18.0 standard drinks    Types: 18 Shots of  liquor per week    Comment: 2-3 shots scotch daily    ALLERGIES:  Allergies  Allergen Reactions  . Levaquin [Levofloxacin In D5w]   . Statins      PERTINENT MEDICATIONS:  Outpatient Encounter Medications as of 11/01/2018  Medication Sig  . albuterol (VENTOLIN HFA) 108 (90 Base) MCG/ACT inhaler Inhale 2 puffs into the lungs every 6 (six) hours as needed for wheezing or shortness of breath.  Marland Kitchen aspirin EC 81 MG tablet Take 81 mg by mouth daily.  . hydrochlorothiazide (HYDRODIURIL) 25 MG tablet Take 25 mg by mouth daily.  . irbesartan (AVAPRO) 300 MG tablet Take 150 mg by mouth daily.  . mupirocin ointment (BACTROBAN) 2 % Apply 1 application topically 2 (two) times daily.  . naproxen sodium (ALEVE) 220 MG tablet Take 220 mg by mouth daily as needed.   No facility-administered encounter medications on file as of 11/01/2018.     PHYSICAL EXAM/ROS:   Current and past weights: 144 lb, 138 lb on home scale General: NAD, frail appearing, reading paper, pleasant and interactive. Cardiovascular: no chest pain reported, no edema, S1S2 Pulmonary: no cough, no increased SOB,DOE , lungs clear except left lower lobe.  Abdomen: appetite fair, denies constipation, continent of bowel GU: denies dysuria, continent of urine MSK:  Walks with assistance, stand by, uses does not use walker or cane. Holding to furniture while walking. Skin: no rashes or wounds reported Neurological: Weakness, orientation at times, reading paper and commenting on current events. Sleeping most of day but today aware of family from [place] in home.  Cyndia Skeeters DNP AGPCNP--BC  COVID-19 PATIENT SCREENING TOOL  Person answering questions: __________[name]_________ _____   1.  Is the patient or any family member in the home showing any signs or symptoms regarding respiratory infection?              Person with Symptom- ___________na________________  a. Fever                                                                           Yes___ No__x_          ___________________  b. Shortness of breath                                                    Yes___ No__x_          ___________________ c. Cough/congestion                                       Yes___  No__x_  ___________________ d. Body aches/pains                                                         Yes___ [place]__        ____________________ e. Gastrointestinal symptoms (diarrhea, nausea)           Yes___ No_x__        ____________________

## 2018-12-23 ENCOUNTER — Other Ambulatory Visit: Payer: Self-pay

## 2018-12-23 ENCOUNTER — Other Ambulatory Visit: Payer: Medicare Other | Admitting: Primary Care

## 2018-12-23 DIAGNOSIS — Z515 Encounter for palliative care: Secondary | ICD-10-CM

## 2018-12-23 NOTE — Progress Notes (Signed)
[facility] Consult Note Telephone: [phone]  Fax: [phone]  PATIENT NAME: Kelsey Russell  [address] [place] [address] [phone] (home)   DOB: 1922-04-10 MRN: [id number]  PRIMARY CARE PROVIDER:   Barbaraann Boys, MD, [place] [address] [phone]  REFERRING PROVIDER:  Barbaraann Boys, [employer] [place] [employer] [place],  [place] [address] [phone]  RESPONSIBLE PARTY:   Extended Emergency Contact Information Primary Emergency Contact: Artelia, [place] Mobile Phone: [phone] Relation: Daughter Secondary Emergency Contact: Candelaria Arenas Phone: [phone] Relation: Daughter   ASSESSMENT AND RECOMMENDATIONS:   1. Advance Care Planning/Goals of Care: Goals include to maximize quality of life and symptom management. MOST still on file and up to date.  DNR, has MOST with  DNR, Comfort measures, trial of antibiotics and  trial of IV fluids both x 7 days, no feeding tube.  2. Symptom Management:   Hand pain: Sightly red on back of hand. Daughter has been rubbing on lidocaine. I Instructed to monitor temp and use warm compresses, also recommended OTC analgesia and stop rub in case it is irritating.  I instructed to go to urgent care if these interventions did not improve or if hand worsens, fever, pain increase, sx of infection.  3. Family /Caregiver/Community Supports:  Live with daughter in town home. Has neighbors who occasionally sit with her when her daughter runs errands.   4. Cognitive / Functional decline: Cognitively at baseline, appears to be reading books. / Able to ambulate with assistance.  5. Follow up Palliative Care Visit: Palliative care will continue to follow for goals of care clarification and symptom management. Return 6 weeks or prn.  I spent 40 minutes providing this consultation,  from 1340 to 1400. More than 50% of the time in this consultation was spent coordinating  communication.   HISTORY OF PRESENT ILLNESS:  Kelsey Russell is a 85 y.o. year old female with multiple medical problems including Dementia, Insomnia, CKD III, Macular degeneration, HTN, H/o DM, now controlled.  Palliative Care was asked to follow this patient by consultation request of Barbaraann Boys, MD to help address advance care planning and goals of care. This is a follow up visit.  CODE STATUS: DNR, has MOST with  DNR, Comfort measures, trial of antibiotics and  trial of IV fluids both x 7 days, no feeding tube.  PPS: 40% HOSPICE ELIGIBILITY/DIAGNOSIS: TBD  PAST MEDICAL HISTORY: Dementia, Insomnia, CKD III, Macular degeneration, HTN, H/o DM, now controlled.  SOCIAL HX:  Social History   Tobacco Use  . Smoking status: Former Smoker    Quit date: 04/26/1966    Years since quitting: 52.6  . Smokeless tobacco: Never Used  Substance Use Topics  . Alcohol use: Yes    Alcohol/week: 18.0 standard drinks    Types: 18 Shots of liquor per week    Comment: 2-3 shots scotch daily    ALLERGIES:  Allergies  Allergen Reactions  . Levaquin [Levofloxacin In D5w]   . Statins      PERTINENT MEDICATIONS:  Outpatient Encounter Medications as of 12/23/2018  Medication Sig  . acetaminophen (TYLENOL) 500 MG tablet Take 500 mg by mouth every 6 (six) hours as needed.  . irbesartan (AVAPRO) 300 MG tablet Take 150 mg by mouth daily.  Marland Kitchen albuterol (VENTOLIN HFA) 108 (90 Base) MCG/ACT inhaler Inhale 2 puffs into the lungs every 6 (six) hours as needed for wheezing or shortness of breath. (Patient not taking: Reported on  12/23/2018)  . aspirin EC 81 MG tablet Take 81 mg by mouth daily.  . hydrochlorothiazide (HYDRODIURIL) 25 MG tablet Take 25 mg by mouth daily.  . mupirocin ointment (BACTROBAN) 2 % Apply 1 application topically 2 (two) times daily.  . naproxen sodium (ALEVE) 220 MG tablet Take 220 mg by mouth daily as needed.   No facility-administered encounter medications on file as of  12/23/2018.     PHYSICAL EXAM / ROS:   Current and past weights:  133.4 lbs., 140 lb at MD office General: NAD, frail appearing, obese Cardiovascular: no chest pain reported, no edema noted  Pulmonary: no cough, no increased SOB, no DOE Abdomen: appetite fair, endorses constipation, incontinent of bowel GU: denies dysuria, stress incontinent of urine MSK:  no joint deformities, ambulatory with stand by assistance Skin: no rashes or wounds reported,Hand redness and warmth. Neurological: Weakness, dementia FAST Stage 6E.  Marijo FileKathryn M Marceline Napierala DNP AGPCNP-BC   COVID-19 PATIENT SCREENING TOOL  Person answering questions: _______Daughter____________ _____   1.  Is the patient or any family member in the home showing any signs or symptoms regarding respiratory infection?               Person with Symptom- ___________na________________  a. Fever                                                                          Yes___ No___          ___________________  b. Shortness of breath                                                    Yes___ No___          ___________________ c. Cough/congestion                                       Yes___  No___         ___________________ d. Body aches/pains                                                         Yes___ No___        ____________________ e. Gastrointestinal symptoms (diarrhea, nausea)           Yes___ No___        ____________________  2. Within the past 14 days, has anyone living in the home had any contact with someone with or under investigation for COVID-19?    Yes___ No_x_   Person __________________

## 2019-02-22 ENCOUNTER — Telehealth: Payer: Self-pay | Admitting: Primary Care

## 2019-02-22 NOTE — Telephone Encounter (Signed)
Spoke with patient's daughter Izora Gala asking if it would be okay to change the time on the f/u visit that was scheduled for 11/23 from 2 PM to 3 PM and she was in agreement with this.

## 2019-02-27 ENCOUNTER — Other Ambulatory Visit: Payer: Self-pay

## 2019-02-27 ENCOUNTER — Other Ambulatory Visit: Payer: Medicare Other | Admitting: Primary Care

## 2019-02-27 DIAGNOSIS — Z515 Encounter for palliative care: Secondary | ICD-10-CM

## 2019-02-27 NOTE — Progress Notes (Signed)
Therapist, nutritional Palliative Care Consult Note Telephone: [phone]  Fax: [phone].  PATIENT NAME: Kelsey Russell [address] [place] [address] [phone] (home)  DOB: [birth year] MRN: [id number]  PRIMARY CARE PROVIDER:   Orene Desanctis, MD, [address] [place] [address] [phone]  REFERRING PROVIDER:  Orene Desanctis, MD [address] [place],  [place] [address] [phone]  RESPONSIBLE PARTY:   Extended Emergency Contact Information Primary Emergency Contact: Kassidy, [place] Mobile Phone: [phone] Relation: Daughter Secondary Emergency Contact: [facility] Home Phone: [phone] Relation: Daughter   ASSESSMENT AND RECOMMENDATIONS:   1. Advance Care Planning/Goals of Care: Goals include to maximize quality of life and symptom management.DNR, MOST with DNR, comfort measures, determined 1 week trial use of antibiotics and fluids, no feeding tube.  2. Symptom Management:   Falls : Denies. Gets oob (I) at hs for bathroom  Pain: Denies. Occ use of OTC meds.  Nutrition: Sleeps late and often eats one meal a day and perhaps some soup at HS. We discussed giving extra nutrition e.g. boost of ensure for more calories. Has h/o DM but diet controlled HgA1C have been very good, 5 and 6.  3. Family /Caregiver/Community Supports:   Lives with daughter who is retired Financial controller. Other family is available if needed.                          Daughter states recent decrease in stress when she re-homed their dog.   4. Cognitive / Functional decline:  At baseline, HOH makes communication difficult. Able to ambulate to BR at hs, Feeds self. Not able to do iadls.   5. Follow up Palliative Care Visit: Palliative care will continue to follow for goals of care clarification and symptom management. Return 8 weeks or prn Appt made for June 25, 2019.  I spent 40 minutes providing this consultation,  from 1500 to 1540. More  than 50% of the time in this consultation was spent coordinating communication.   HISTORY OF PRESENT ILLNESS:  Kelsey Russell is a 85 y.o. year old female with multiple medical problems including DM2, HTN, Alzheimer's Dementia, HLD, CKD III. Palliative Care was asked to follow this patient by consultation request of Orene Desanctis, MD to help address advance care planning and goals of care. This is a follow up visit.  CODE STATUS: DNR,  MOST with DNR, comfort measures, determined 1 week trial use of antibiotics and fluids, no feeding tube.  PPS: 30% HOSPICE ELIGIBILITY/DIAGNOSIS: TBD  PAST MEDICAL HISTORY:   DM2, HTN, Alzheimer's Dementia, HLD, CKD III.  SOCIAL HX:  Social History   Tobacco Use  . Smoking status: Former Smoker    Quit date: 04/26/1966    Years since quitting: 52.8  . Smokeless tobacco: Never Used  Substance Use Topics  . Alcohol use: Yes    Alcohol/week: 18.0 standard drinks    Types: 18 Shots of liquor per week    Comment: 2-3 shots scotch daily    ALLERGIES:  Allergies  Allergen Reactions  . Levaquin [Levofloxacin In D5w]   . Statins      PERTINENT MEDICATIONS:  Outpatient Encounter Medications as of 02/27/2019  Medication Sig  . acetaminophen (TYLENOL) 500 MG tablet Take 500 mg by mouth every 6 (six) hours as needed.  Marland Kitchen albuterol (VENTOLIN HFA) 108 (90 Base) MCG/ACT inhaler Inhale 2 puffs into the lungs every 6 (six) hours as needed for wheezing or shortness of  breath.  . irbesartan (AVAPRO) 300 MG tablet Take 150 mg by mouth daily.  Marland Kitchen aspirin EC 81 MG tablet Take 81 mg by mouth daily.  . hydrochlorothiazide (HYDRODIURIL) 25 MG tablet Take 25 mg by mouth daily.  . mupirocin ointment (BACTROBAN) 2 % Apply 1 application topically 2 (two) times daily.  . naproxen sodium (ALEVE) 220 MG tablet Take 220 mg by mouth daily as needed.   No facility-administered encounter medications on file as of 02/27/2019.    PHYSICAL EXAM / ROS:   Current and past  weights: lost some weight due to clothing loosening. MD visit 135 lbs, Has been 140 lb  3 months ago. General: NAD, frail appearing, HOH Cardiovascular: no chest pain reported, no edema Pulmonary: no cough, no increased SOB Abdomen: appetite fair, sometimes eats one meal a day,  No constipation, continent of bowel GU: denies dysuria, continent of urine MSK:  no joint deformities, ambulatory without assistance. Toilets alone, eats alone. Can dress with help.  No falls. Has walker if needed Skin: no rashes or wounds reported Neurological: Weakness, Sleeps 18 hrs a day. FAST score 6B  [name], NP  COVID-19 PATIENT SCREENING TOOL  Person answering questions: __________Jonnie_________ _____   1.  Is the patient or any family member in the home showing any signs or symptoms regarding respiratory infection?               Person with Symptom- ________NA___________________  a. Fever                                                                          Yes___ No___          ___________________  b. Shortness of breath                                                    Yes___ No___          ___________________ c. Cough/congestion                                       Yes___  No___         ___________________ d. Body aches/pains                                                         Yes___ No___        ____________________ e. Gastrointestinal symptoms (diarrhea, nausea)           Yes___ No___        ____________________  2. Within the past 14 days, has anyone living in the home had any contact with someone with or under investigation for COVID-19?    Yes___ No__X__Person __________________

## 2019-04-27 ENCOUNTER — Other Ambulatory Visit: Payer: Medicare Other | Admitting: Primary Care

## 2019-05-04 ENCOUNTER — Other Ambulatory Visit: Payer: Medicare Other | Admitting: Primary Care

## 2019-05-21 IMAGING — CR DG CHEST 2V
2 series · 2 of 2 positions shown · non-contrast
Comparison: None.

CLINICAL DATA: Polycythemia and former smoker with history of prior
pneumonia and bronchitis.

EXAM:
CHEST  2 VIEW

[chest pa]
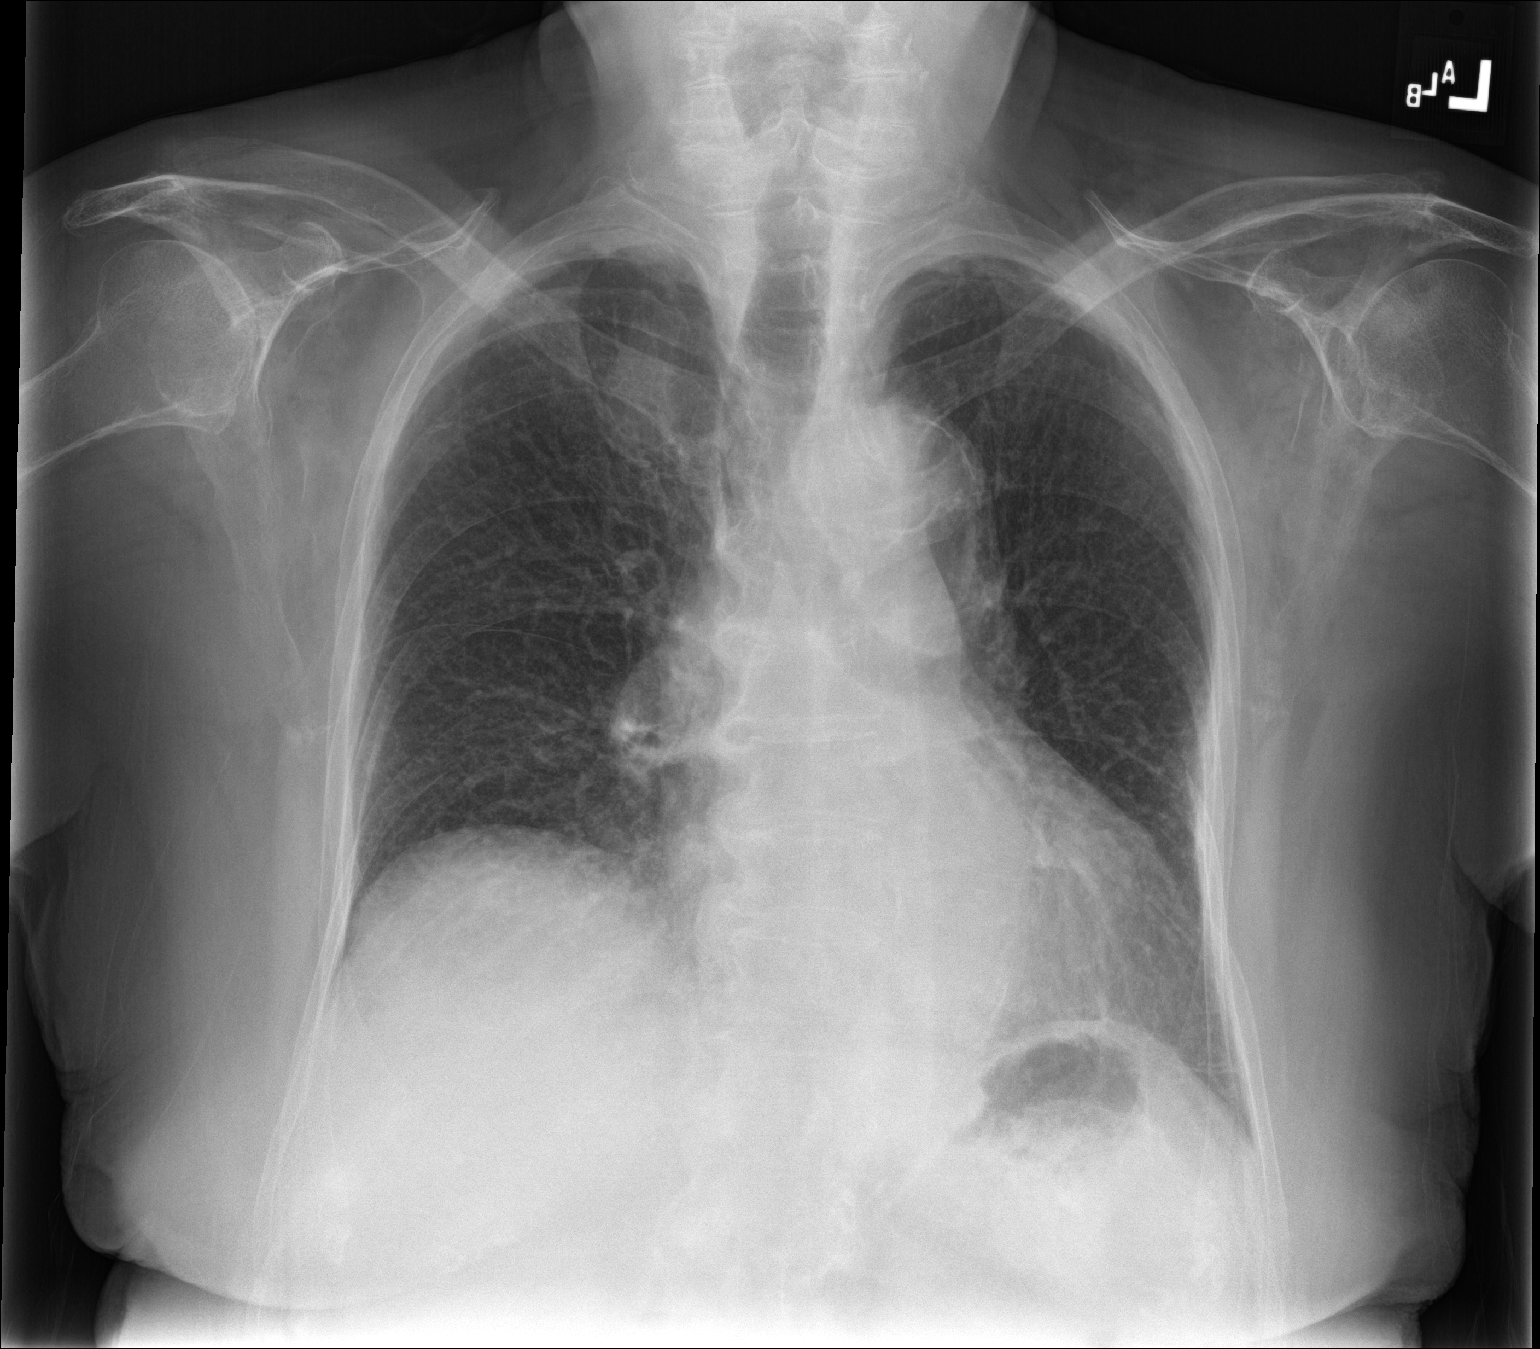

[chest lat]
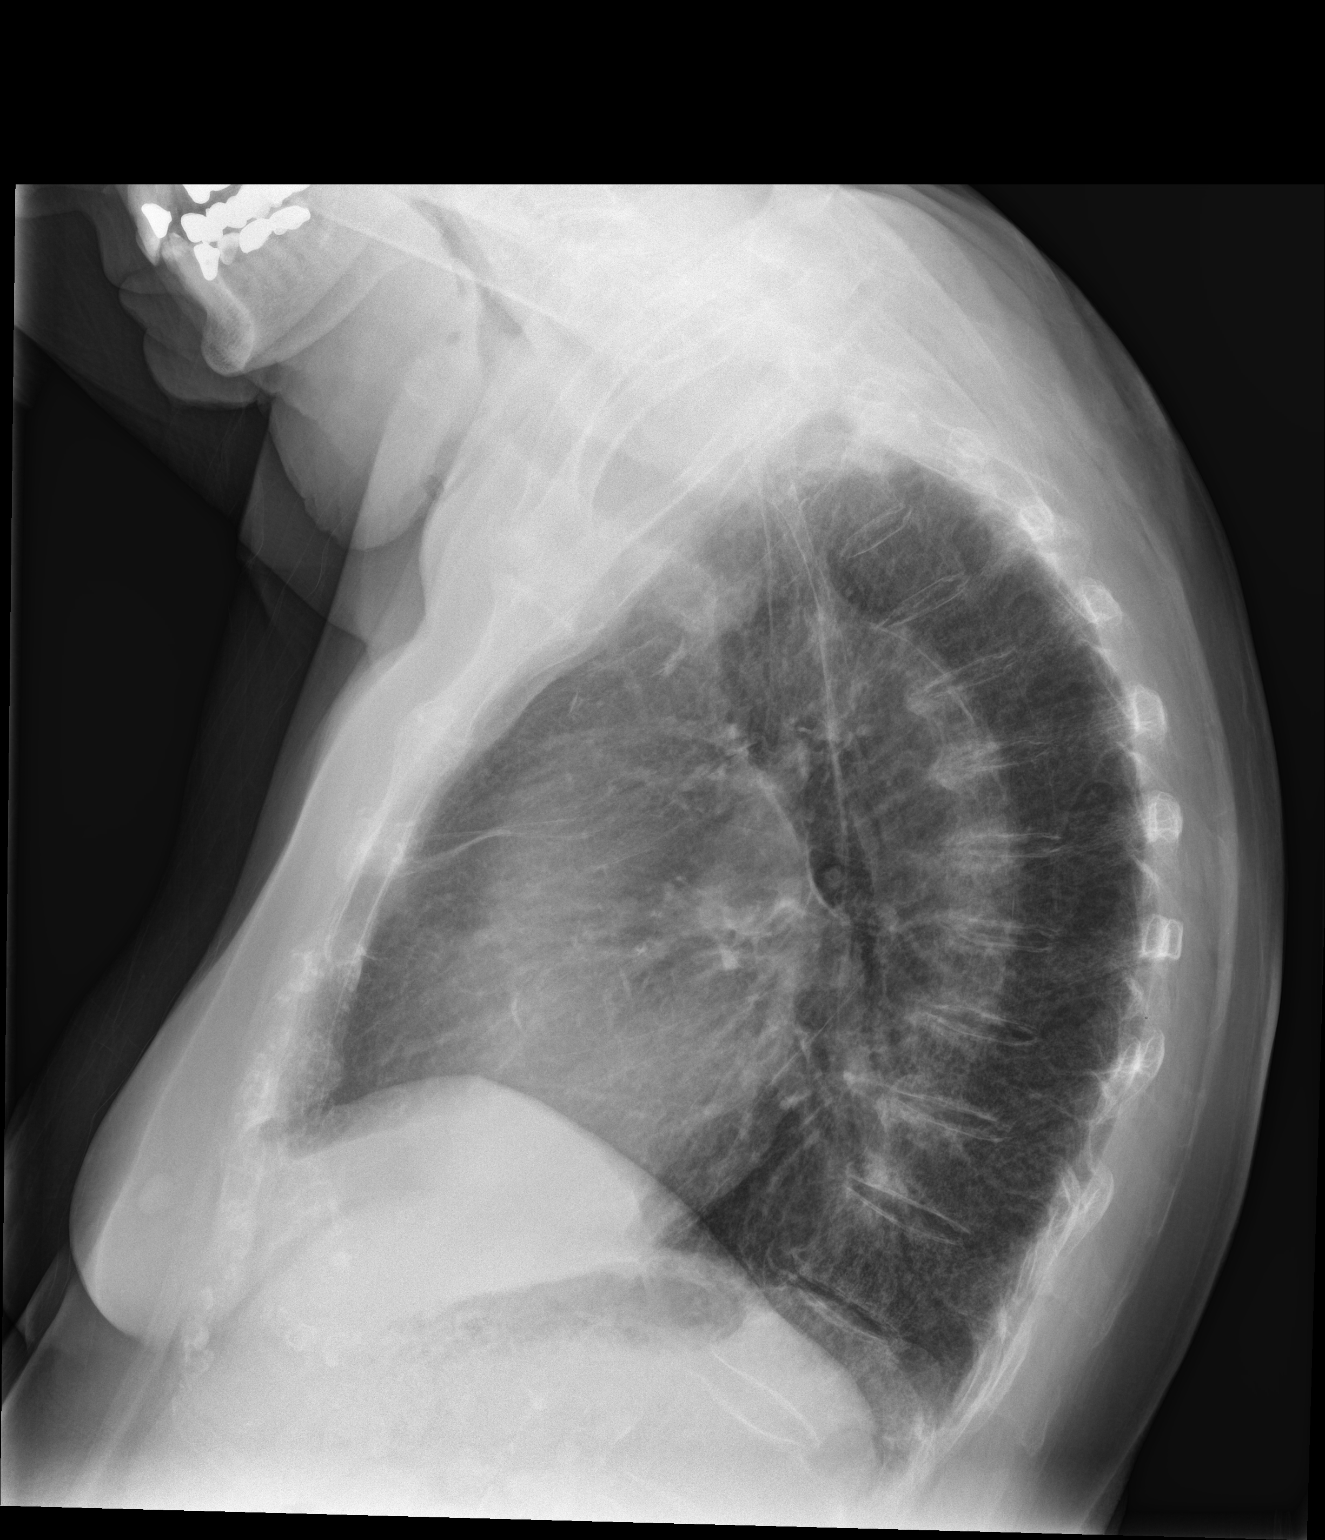

[2 of 2 positions shown; findings below may reference images not displayed]

FINDINGS: The heart size is normal. The thoracic aorta demonstrates
atherosclerosis and tortuosity. There is some elevation of the right
hemidiaphragm. There is no evidence of pulmonary edema,
consolidation, pneumothorax, nodule or pleural fluid. The bony
thorax shows osteopenia and degenerative disc disease throughout the
thoracic spine.
IMPRESSION: No significant chronic lung disease or acute findings. Thoracic
aortic atherosclerosis present with tortuosity of the thoracic
aorta.

## 2019-06-01 ENCOUNTER — Other Ambulatory Visit: Payer: Self-pay

## 2019-06-01 ENCOUNTER — Other Ambulatory Visit: Payer: Medicare Other | Admitting: Primary Care

## 2019-06-01 DIAGNOSIS — Z515 Encounter for palliative care: Secondary | ICD-10-CM

## 2019-06-01 NOTE — Progress Notes (Signed)
Therapist, nutritional Palliative Care Consult Note Telephone: [phone]  Fax: [phone]   PATIENT NAME: Kelsey Russell [address] [place] [address] [phone] (home)  DOB: May 26, 1922 MRN: [id number]  PRIMARY CARE PROVIDER:   Orene Desanctis, MD, [address] [place] [address] [phone]  REFERRING PROVIDER:  Orene Desanctis, MD [address] [place],  [place] [address] [phone]  RESPONSIBLE PARTY:   Extended Emergency Contact Information Primary Emergency Contact: Marlon, [place] Mobile Phone: [phone] Relation: Daughter Secondary Emergency Contact: [facility] Home Phone: [phone] Relation: Daughter   ASSESSMENT AND RECOMMENDATIONS:   1. Advance Care Planning/Goals of Care: Goals include to maximize quality of life and symptom management. Advance directives remain in place, DNR, MOST with DNR, comfort measures, determined 1 week trial use of antibiotics and fluids, no feeding tube. Daughter has some health issues recently  and we discussed how she would get assistance with her mother for her own appointments.   2. Symptom Management:   Nutrition: Eating 2 meals a day.  Kelsey Russell is losing weight, now 10% in 6 months. She snacks also. We discussed augmenting with boost or ensure to get some extra calories in during the day time hours. She sleeps 16-18 hours / day.  Heart Failure: Down to minimal ARB rx. No edema. No SOB, DOE, cough. Appears comfortable at rest.  Vaccine: Got all the vaccines, complete sets of covid, flu and shingrix.  ADLs: Does only sponge bathing. Offered shower chair with hand held if she wants to get a full shower. They will look for one in a store and let me know if they need an order for a shower bench.  Mobility: Walks without walker,  No falls, able to toilet self, can get up out of a chair alone. She does not use stairs in home.   Memory loss: Daughter reports some increased short  term memory loss. Has several staying she repeats, which are her sayings.  3. Family /Caregiver/Community Supports: Lives with daughter in town home. Other family out of area. Daughter is pcg, and has some help from friends and neighbors.   4. Cognitive / Functional decline: Some increasing memory loss. We discussed more mental stimulation. Needs cuing for adls. Functionally able to ambulate (I).   5. Follow up Palliative Care Visit: Palliative care will continue to follow for goals of care clarification and symptom management. Return 12 weeks or prn.  I spent 60 minutes providing this consultation,  from 1530 to 1630. More than 50% of the time in this consultation was spent coordinating communication.   HISTORY OF PRESENT ILLNESS:  Kelsey Russell is a 86 y.o. year old female with multiple medical problems including DM2, HTN, Alzheimer's Dementia, HLD, CKD III. Palliative Care was asked to follow this patient by consultation request of Orene Desanctis, MD to help address advance care planning and goals of care. This is a follow up visit.  CODE STATUS: DNR, MOST with DNR, comfort measures, determined 1 week trial use of antibiotics and fluids, no feeding tube.  PPS: 30% HOSPICE ELIGIBILITY/DIAGNOSIS: TBD  PAST MEDICAL HISTORY: DM2, HTN, Alzheimer's Dementia, HLD, CKD III. SOCIAL HX:  Social History   Tobacco Use  . Smoking status: Former Smoker    Quit date: 04/26/1966    Years since quitting: 53.1  . Smokeless tobacco: Never Used  Substance Use Topics  . Alcohol use: Yes    Alcohol/week: 18.0 standard drinks    Types: 18 Shots of liquor per  week    Comment: 2-3 shots scotch daily    ALLERGIES:  Allergies  Allergen Reactions  . Levaquin [Levofloxacin In D5w]   . Statins      PERTINENT MEDICATIONS:  Outpatient Encounter Medications as of 06/01/2019  Medication Sig  . acetaminophen (TYLENOL) 500 MG tablet Take 500 mg by mouth every 6 (six) hours as needed.  Marland Kitchen albuterol  (VENTOLIN HFA) 108 (90 Base) MCG/ACT inhaler Inhale 2 puffs into the lungs every 6 (six) hours as needed for wheezing or shortness of breath.  Marland Kitchen aspirin EC 81 MG tablet Take 81 mg by mouth daily.  . hydrochlorothiazide (HYDRODIURIL) 25 MG tablet Take 25 mg by mouth daily.  . irbesartan (AVAPRO) 300 MG tablet Take 150 mg by mouth daily.  . mupirocin ointment (BACTROBAN) 2 % Apply 1 application topically 2 (two) times daily.  . naproxen sodium (ALEVE) 220 MG tablet Take 220 mg by mouth daily as needed.   No facility-administered encounter medications on file as of 06/01/2019.    PHYSICAL EXAM / ROS:   Current and past weights: 126.6;  10 % weight loss in 6 months General: NAD, frail appearing, HOH, denies pain Cardiovascular: no chest pain reported, no edema Pulmonary: no cough, no increased SOB, room air Abdomen: appetite fair, denies constipation, continent of bowel GU: denies dysuria, continent of urine MSK:  no joint deformities, ambulatory (I), no falls Skin: no rashes or wounds reported Neurological: Weakness,  Dementia score FAST 6 B.  Sleeps 18 hours a day.  Eliezer Lofts, NP, [facility]  COVID-19 PATIENT SCREENING TOOL  Person answering questions: ______[name]_____________ _____   1.  Is the patient or any family member in the home showing any signs or symptoms regarding respiratory infection?               Person with Symptom- ___________________________  a. Fever                                                                          Yes___ No___          ___________________  b. Shortness of breath                                                    Yes___ No___          ___________________ c. Cough/congestion                                       Yes___  No___         ___________________ d. Body aches/pains                                                         Yes___ No___        ____________________ e. Gastrointestinal symptoms (diarrhea, nausea)  Yes___  No___        ____________________  2. Within the past 14 days, has anyone living in the home had any contact with someone with or under investigation for COVID-19?    Yes___ No_x_   Person __________________

## 2019-07-14 ENCOUNTER — Ambulatory Visit
Admission: RE | Admit: 2019-07-14 | Discharge: 2019-07-14 | Disposition: A | Discharge status: Home / Self Care | Payer: Medicare Other | Source: Ambulatory Visit | Attending: Pediatrics | Admitting: Pediatrics

## 2019-07-14 ENCOUNTER — Ambulatory Visit
Admission: RE | Admit: 2019-07-14 | Discharge: 2019-07-14 | Disposition: A | Discharge status: Home / Self Care | Payer: Medicare Other | Attending: Pediatrics | Admitting: Pediatrics

## 2019-07-14 ENCOUNTER — Other Ambulatory Visit: Payer: Self-pay | Admitting: Pediatrics

## 2019-07-14 DIAGNOSIS — R0989 Other specified symptoms and signs involving the circulatory and respiratory systems: Secondary | ICD-10-CM

## 2019-07-28 ENCOUNTER — Telehealth: Payer: Self-pay | Admitting: Primary Care

## 2019-07-28 NOTE — Telephone Encounter (Signed)
Called to assess for need for palliative visit. Will t/c week of May  17 to make appt.

## 2019-09-06 ENCOUNTER — Telehealth: Payer: Self-pay | Admitting: Primary Care

## 2019-09-06 NOTE — Telephone Encounter (Signed)
T/c to schedule PC appt. Message left.

## 2019-09-12 ENCOUNTER — Other Ambulatory Visit: Payer: Medicare Other | Admitting: Primary Care

## 2019-09-12 ENCOUNTER — Other Ambulatory Visit: Payer: Self-pay

## 2019-09-12 DIAGNOSIS — Z515 Encounter for palliative care: Secondary | ICD-10-CM

## 2019-09-12 DIAGNOSIS — F0390 Unspecified dementia without behavioral disturbance: Secondary | ICD-10-CM

## 2019-09-12 NOTE — Progress Notes (Signed)
[facility] Consult Note Telephone: [phone]  Fax: [phone]  PATIENT NAME: Kelsey Russell [address] [place] [address] [phone] (home)  DOB: 02/13/1923 MRN: [id number]  PRIMARY CARE PROVIDER:    Barbaraann Boys, MD,  [place] [address] [phone]  REFERRING PROVIDER:   Barbaraann Boys, [place],  [place] [address] [phone]  RESPONSIBLE PARTY:   Extended Emergency Contact Information Primary Emergency Contact: Etienne, [place] Mobile Phone: [phone] Relation: Daughter Secondary Emergency Contact: [place] Phone: [phone] Relation: Daughter  I met with patient and family in home.  ASSESSMENT AND RECOMMENDATIONS:   1. Advance Care Planning/Goals of Care: Goals include to maximize quality of life and symptom management. DNR, MOST with DNR, comfort measures, determined 1 week trial use of antibiotics and fluids, no feeding tube. Daughter undergoing chemo therapy and has some help with caregiving, meals, etc. Feels she's coping well for now.  2. Symptom Management:  Nutrition; Eating 50%, maintaining weight. She eats about 2 meals a day and snacks.   Heart failure: Stable, no edema, SOB or cough. Easily ambulates short distances.  Dementia: This appears stable, FAST score is stable at 6B.  3. Family /Caregiver/Community Supports: Lives with daughter, has relatives in nearby towns who also help.  4. Cognitive / Functional decline: Needs cues but follows directions well. Able to do many adls with cueing but daughter reports refusal to change clothing often. Does not have agitation or sundowning behavior.  5. Follow up Palliative Care Visit: Palliative care will continue to follow for goals of care clarification and symptom management. Return 12-16 weeks or prn.  I spent 60 minutes providing this consultation,  from 1230 to  1330. More than 50% of  the time in this consultation was spent coordinating communication.   HISTORY OF PRESENT ILLNESS:  Kelsey Russell is a 86 y.o. year old female with multiple medical problems including moderate dementia, CHF. Palliative Care was asked to follow this patient by consultation request of Barbaraann Boys, MD to help address advance care planning and goals of care. This is a follow up visit.  CODE STATUS: DNR, MOST with DNR, comfort measures, determined 1 week trial use of antibiotics and fluids, no feeding tube.  PPS: 40%  HOSPICE ELIGIBILITY/DIAGNOSIS: No  PAST MEDICAL HISTORY: DM2, HTN, Alzheimer's Dementia, HLD, CKD III. SOCIAL HX:  Social History   Tobacco Use  . Smoking status: Former Smoker    Quit date: 04/26/1966    Years since quitting: 53.4  . Smokeless tobacco: Never Used  Substance Use Topics  . Alcohol use: Yes    Alcohol/week: 18.0 standard drinks    Types: 18 Shots of liquor per week    Comment: 2-3 shots scotch daily    ALLERGIES:  Allergies  Allergen Reactions  . Levaquin [Levofloxacin In D5w]   . Statins      PERTINENT MEDICATIONS:  Outpatient Encounter Medications as of 09/12/2019  Medication Sig  . acetaminophen (TYLENOL) 500 MG tablet Take 500 mg by mouth every 6 (six) hours as needed.  Marland Kitchen albuterol (VENTOLIN HFA) 108 (90 Base) MCG/ACT inhaler Inhale 2 puffs into the lungs every 6 (six) hours as needed for wheezing or shortness of breath.  Marland Kitchen aspirin EC 81 MG tablet Take 81 mg by mouth daily.  . hydrochlorothiazide (HYDRODIURIL) 25 MG tablet Take 25 mg by mouth daily.  . irbesartan (AVAPRO) 300 MG tablet Take 150 mg by mouth daily.  Marland Kitchen  mupirocin ointment (BACTROBAN) 2 % Apply 1 application topically 2 (two) times daily.  . naproxen sodium (ALEVE) 220 MG tablet Take 220 mg by mouth daily as needed.   No facility-administered encounter medications on file as of 09/12/2019.    PHYSICAL EXAM / ROS:   Current and past weights: 128 lbs which is 2 lb gain from  2/21 General: NAD, frail appearing, thin Cardiovascular: no chest pain reported, no edema  Pulmonary: no cough, no increased SOB, room air Abdomen: appetite fair, denies constipation, continent of bowel GU: denies dysuria, incontinent of urine at times MSK:  no joint and ROM abnormalities, ambulatory with stand by  Neurological: Weakness, sleeping more, 630 am- 11 am. FAST score 6 B  [name], NP [facility]  COVID-19 PATIENT SCREENING TOOL  Person answering questions: _________[name]______ _____   1.  Is the patient or any family member in the home showing any signs or symptoms regarding respiratory infection?               Person with Symptom- __________NA_________________  a. Fever                                                                          Yes___ No___          ___________________  b. Shortness of breath                                                    Yes___ No___          ___________________ c. Cough/congestion                                       Yes___  No___         ___________________ d. Body aches/pains                                                         Yes___ No___        ____________________ e. Gastrointestinal symptoms (diarrhea, nausea)           Yes___ No___        ____________________  2. Within the past 14 days, has anyone living in the home had any contact with someone with or under investigation for COVID-19?    Yes___ No_X_   Person __________________

## 2020-01-18 ENCOUNTER — Telehealth: Payer: Self-pay

## 2020-01-18 NOTE — Telephone Encounter (Signed)
Volunteer check in call made for palliative care: No answer 

## 2020-02-08 ENCOUNTER — Other Ambulatory Visit: Payer: Medicare Other | Admitting: Primary Care

## 2020-02-08 ENCOUNTER — Telehealth: Payer: Self-pay

## 2020-02-08 ENCOUNTER — Other Ambulatory Visit: Payer: Self-pay

## 2020-02-08 DIAGNOSIS — H903 Sensorineural hearing loss, bilateral: Secondary | ICD-10-CM

## 2020-02-08 DIAGNOSIS — F0390 Unspecified dementia without behavioral disturbance: Secondary | ICD-10-CM

## 2020-02-08 DIAGNOSIS — I10 Essential (primary) hypertension: Secondary | ICD-10-CM

## 2020-02-08 DIAGNOSIS — Z515 Encounter for palliative care: Secondary | ICD-10-CM

## 2020-02-08 NOTE — Telephone Encounter (Signed)
Palliative care volunteer call made to check in on patient. No answer 

## 2020-02-08 NOTE — Progress Notes (Signed)
[facility] Consult Note Telephone: [phone]  Fax: [phone]    Date of encounter: 02/08/20 PATIENT NAME: Kelsey Russell [address] [place] [address] [phone] (home)  DOB: 02/14/1923 MRN: [id number]  PRIMARY CARE PROVIDER:    Barbaraann Boys, MD,  [place] [address] [phone]  REFERRING PROVIDER:   Barbaraann Boys, [place],  [place] [address] [phone]  RESPONSIBLE PARTY:   Extended Emergency Contact Information Primary Emergency Contact: Ilianna, [place] Mobile Phone: [phone] Relation: Daughter Secondary Emergency Contact: [place] Phone: [phone] Relation: Daughter  I met face to face with patient and family in  Home. Palliative Care was asked to follow this patient by consultation request of Barbaraann Boys, MD to help address advance care planning and goals of care. This is a follow up  visit.   ASSESSMENT AND RECOMMENDATIONS:   1. Advance Care Planning/Goals of Care: Goals include to maximize quality of life and symptom management. Our advance care planning conversation included a discussion about:     The value and importance of advance care planning   Experiences with loved ones who have been seriously ill or have died   Exploration of goals of care in the event of a sudden injury or illness   Identification  of a healthcare agent - [name] and her brother  Review and updating  of an  advance directive document . Has made funeral arrangements recently. We discussed the MOST form and there are no changes. Signed and dated as reviewed.  2. Symptom Management:   Nutrition: Poor, eating 25-50% of food. Recommend nutritional supplements, high protein. Recommend mirtazapine 7.5 mg for appetite.  Sent to [employer].   Mobility: Very good, patient can rise (I) and  Walk without a device, needs stand by assist for safety.  3.  Follow up Palliative Care Visit: Palliative care will continue to follow for goals of care clarification and symptom management. Return 8 weeks or prn.  4. Family /Caregiver/Community Supports: Lives with daughter in own home. Daughter has recently recovered from breast cancer.  5. Cognitive / Functional decline:  A and O x 1, HOH. Dependent in all adls and iadls.  I spent 60 minutes providing this consultation,  from 1400  To 1500. More than 50% of the time in this consultation was spent coordinating communication.   CODE STATUS: DNR  PPS: 40%  HOSPICE ELIGIBILITY/DIAGNOSIS: TBD  Subjective:  CHIEF COMPLAINT: weight loss  HISTORY OF PRESENT ILLNESS:  Kelsey Russell is a 86 y.o. year old female  with dementia, debility and weight loss. Has been taking less po and has lost 8 lbs since June. She eats only 25% of her meals. We discussed adding ensure which she might take as a milkshake.   We are asked to consult around goals of care and care of dementia patient.    History obtained from review of EMR, discussion with primary team, and  interview with family and/or Kelsey Russell. Records reviewed and summarized above.   CURRENT PROBLEM LIST:  Patient Active Problem List   Diagnosis Date Noted  . Dementia, old age, without behavioral disturbance ([employer]) 06/01/2018  . Polycythemia 05/03/2017  . Stage 3 chronic kidney disease ([facility]) 04/09/2017  . CME (cystoid macular edema), right 01/06/2017  . Branch retinal vein occlusion of right eye with macular edema 05/13/2016  . HTN (hypertension) 10/04/2014  . Sensorineural hearing loss (SNHL) of both ears 10/04/2014  PAST MEDICAL HISTORY:  Active Ambulatory Problems    Diagnosis Date Noted  . Polycythemia 05/03/2017  . Branch retinal vein occlusion of right eye with macular edema 05/13/2016  . CME (cystoid macular edema), right 01/06/2017  . Dementia, old age, without behavioral disturbance ([facility]) 06/01/2018  . HTN (hypertension)  10/04/2014  . Sensorineural hearing loss (SNHL) of both ears 10/04/2014  . Stage 3 chronic kidney disease ([facility]) 04/09/2017   Resolved Ambulatory Problems    Diagnosis Date Noted  . No Resolved Ambulatory Problems   No Additional Past Medical History    SOCIAL HX:  Social History   Tobacco Use  . Smoking status: Former Smoker    Quit date: 04/26/1966    Years since quitting: 53.8  . Smokeless tobacco: Never Used  Substance Use Topics  . Alcohol use: Yes    Alcohol/week: 18.0 standard drinks    Types: 18 Shots of liquor per week    Comment: 2-3 shots scotch daily   FAMILY HX:  Family History  Problem Relation Age of Onset  . Cancer Sister   . Cancer Brother   . Cancer Sister   . Cancer Son   . Cancer Brother     ALLERGIES:  Allergies  Allergen Reactions  . Levaquin [Levofloxacin In D5w]   . Statins      PERTINENT MEDICATIONS:  Outpatient Encounter Medications as of 02/08/2020  Medication Sig  . acetaminophen (TYLENOL) 500 MG tablet Take 500 mg by mouth every 6 (six) hours as needed.  Marland Kitchen albuterol (VENTOLIN HFA) 108 (90 Base) MCG/ACT inhaler Inhale 2 puffs into the lungs every 6 (six) hours as needed for wheezing or shortness of breath.  Marland Kitchen aspirin EC 81 MG tablet Take 81 mg by mouth daily.  . hydrochlorothiazide (HYDRODIURIL) 25 MG tablet Take 25 mg by mouth daily.  . irbesartan (AVAPRO) 300 MG tablet Take 150 mg by mouth daily.  . mupirocin ointment (BACTROBAN) 2 % Apply 1 application topically 2 (two) times daily.  . naproxen sodium (ALEVE) 220 MG tablet Take 220 mg by mouth daily as needed.   No facility-administered encounter medications on file as of 02/08/2020.    Objective: ROS Review of Systems: All other systems asked and negative except as noted in HPI.  General: NAD EYES: denies vision changes, wears glasses ENMT: denies dysphagia Cardiovascular: denies chest pain Pulmonary: denies  cough, denies increased SOB,  Abdomen: endorses fair appetite,  endorses constipation, endorses incontinence of bowel at times GU: denies dysuria, endorses incontinence of urine MSK:  endorses ROM limitations, no falls reported Skin: denies rashes or wounds Neurological: endorses weakness, denies pain, denies insomnia Psych: Endorses positive mood Heme/lymph/immuno: denies bruises, abnormal bleeding  Physical Exam: Current and past weights: 121.4 lbs, 8 lbs loss since 6/21 Constitutional: NAD General :frail appearing, thin EYES: anicteric sclera, lids intact, no discharge  ENMT: hear of hearing,oral mucous membranes moist, dentition intact CV: no LE edema Pulmonary: no increased work of breathing, no cough, no audible wheezes, room air Abdomen: intake 25-50%,  no ascites GU: deferred MSK: mild sacropenia, decreased ROM in all extremities, no contractures of LE,  ambulatory Skin: warm and dry, no rashes or wounds on visible skin Neuro: Weakness, moderate cognitive impairment, grossly non -focal Psych: non -anxious affect, A and O x 1-2 Hem/lymph/immuno/ no widespread bruising   Thank you for the opportunity to participate in the care of Kelsey Russell.  The palliative care team will continue to follow. Please call our office at [phone] if  we can be of additional assistance.  [name], NP , DNP, MPH, AGPCNP-BC, ACHPN  COVID-19 PATIENT SCREENING TOOL  Person answering questions: ____________[name]______ _____   1.  Is the patient or any family member in the home showing any signs or symptoms regarding respiratory infection?               Person with Symptom- __________NA_________________  a. Fever                                                                          Yes___ No___          ___________________  b. Shortness of breath                                                    Yes___ No___          ___________________ c. Cough/congestion                                       Yes___  No___          ___________________ d. Body aches/pains                                                         Yes___ No___        ____________________ e. Gastrointestinal symptoms (diarrhea, nausea)           Yes___ No___        ____________________  2. Within the past 14 days, has anyone living in the home had any contact with someone with or under investigation for COVID-19?    Yes___ No_X_   Person __________________

## 2020-03-11 ENCOUNTER — Telehealth: Payer: Self-pay | Admitting: Primary Care

## 2020-03-11 NOTE — Telephone Encounter (Signed)
T/c from daughter  Over weekend, who said patient was not getting up for a few days and not eating well. She states she dropped a big book on her foot, which is painful and bruised. Today she is up and saying her foot is better, and ate well today. Encouraged to call back if she declines again.

## 2020-03-22 ENCOUNTER — Telehealth: Payer: Self-pay

## 2020-03-22 NOTE — Telephone Encounter (Signed)
Volunteer called patient on behalf of Palliative Care and did not get a answer from patient/family. ° °

## 2020-03-31 ENCOUNTER — Telehealth: Payer: Self-pay | Admitting: Primary Care

## 2020-03-31 NOTE — Telephone Encounter (Signed)
T/c from daughter. Patient has urinated 10-12 times over the day, and appears more confused. She tried to urinate at her recliner thinking it was the toilet. She also ate a small amount yesterday. Treating empirically with cefdinir 300 mg daily (renal dosing). Patient daughter to f/u with PCP and I will see her in a few weeks as scheduled. Instructed to call back or seek intervention if patient declines.

## 2020-04-16 ENCOUNTER — Other Ambulatory Visit: Payer: Medicare Other | Admitting: Primary Care

## 2020-04-16 ENCOUNTER — Other Ambulatory Visit: Payer: Self-pay

## 2020-04-16 DIAGNOSIS — F0390 Unspecified dementia without behavioral disturbance: Secondary | ICD-10-CM

## 2020-04-16 DIAGNOSIS — H903 Sensorineural hearing loss, bilateral: Secondary | ICD-10-CM

## 2020-04-16 DIAGNOSIS — Z515 Encounter for palliative care: Secondary | ICD-10-CM

## 2020-04-16 NOTE — Progress Notes (Signed)
[facility] Consult Note Telephone: [phone]  Fax: [phone]     Date of encounter: 04/16/20 PATIENT NAME: Kelsey Russell [address] [place] [address] [phone] (home)  DOB: Jan 05, 1923 MRN: [id number]  PRIMARY CARE PROVIDER:    Barbaraann Boys, MD,  [place] [address] [phone]  REFERRING PROVIDER:   Barbaraann Boys, [place],  [place] [address] [phone]  RESPONSIBLE PARTY:   Extended Emergency Contact Information Primary Emergency Contact: [place], [place] Mobile Phone: [phone] Relation: Daughter Secondary Emergency Contact: [place] Phone: [phone] Relation: Daughter  I met face to face with patient and family in  home. Palliative Care was asked to follow this patient by consultation request of Barbaraann Boys, MD to help address advance care planning and goals of care. This is a follow up visit.   ASSESSMENT AND RECOMMENDATIONS:   1. Advance Care Planning/Goals of Care: Goals include to maximize quality of life and symptom management. DNR in home. No changes.Limited/ comfort goals of care.  2. Symptom Management:   Nutrition: Used mirtazapine some, has gained 4 lbs. Daughter to give half dose at hs. Continue to encourage nutritional po intake; pt enjoys snacks. Also push water.  Mobility: Able to rise and walks with furniture touch in home. Discussed using rollator as patient had a fall recently. She was not hurt but we discussed fall prevention.  3. Follow up Palliative Care Visit: Palliative care will continue to follow for goals of care clarification and symptom management. Return 8 weeks or prn.  4. Family /Caregiver/Community Supports: Lives with daughter in own home .  5. Cognitive / Functional decline: A and O x 2, knows she is last surviving sibling of 73. Very HOH. recommend use of hearing aides. Able to do some adls, can toilet  and feed self. Dependent in all iadls.  I spent 40 minutes providing this consultation,  from 1530 to 1610. More than 50% of the time in this consultation was spent coordinating communication.   CODE STATUS: DNR  PPS: 50%  HOSPICE ELIGIBILITY/DIAGNOSIS: no  Subjective:  CHIEF COMPLAINT: debility  HISTORY OF PRESENT ILLNESS:  Kelsey Russell is a 87 y.o. year old female  with dementia, wt loss history, fall risk .   We are asked to consult around advance care planning and complex medical decision making.   History obtained from review of EMR, discussion with primary team, and  interview with family, caregiver  and/or Kelsey Russell. Records reviewed and summarized above.   CURRENT PROBLEM LIST:  Patient Active Problem List   Diagnosis Date Noted  . Dementia, old age, without behavioral disturbance ([facility]) 06/01/2018  . Polycythemia 05/03/2017  . Stage 3 chronic kidney disease ([facility]) 04/09/2017  . CME (cystoid macular edema), right 01/06/2017  . Branch retinal vein occlusion of right eye with macular edema 05/13/2016  . HTN (hypertension) 10/04/2014  . Sensorineural hearing loss (SNHL) of both ears 10/04/2014   PAST MEDICAL HISTORY:  Active Ambulatory Problems    Diagnosis Date Noted  . Polycythemia 05/03/2017  . Branch retinal vein occlusion of right eye with macular edema 05/13/2016  . CME (cystoid macular edema), right 01/06/2017  . Dementia, old age, without behavioral disturbance ([facility]) 06/01/2018  . HTN (hypertension) 10/04/2014  . Sensorineural hearing loss (SNHL) of both ears 10/04/2014  . Stage 3 chronic kidney disease ([facility]) 04/09/2017   Resolved Ambulatory Problems    Diagnosis Date Noted  . No Resolved  Ambulatory Problems   No Additional Past Medical History   SOCIAL HX:  Social History   Tobacco Use  . Smoking status: Former Smoker    Quit date: 04/26/1966    Years since quitting: 54.0  . Smokeless tobacco: Never Used  Substance Use Topics  . Alcohol  use: Yes    Alcohol/week: 18.0 standard drinks    Types: 18 Shots of liquor per week    Comment: 2-3 shots scotch daily   FAMILY HX:  Family History  Problem Relation Age of Onset  . Cancer Sister   . Cancer Brother   . Cancer Sister   . Cancer Son   . Cancer Brother       ALLERGIES:  Allergies  Allergen Reactions  . Levaquin [Levofloxacin In D5w]   . Statins      PERTINENT MEDICATIONS:  Outpatient Encounter Medications as of 04/16/2020  Medication Sig  . acetaminophen (TYLENOL) 500 MG tablet Take 500 mg by mouth every 6 (six) hours as needed.  . irbesartan (AVAPRO) 300 MG tablet Take 150 mg by mouth daily.   No facility-administered encounter medications on file as of 04/16/2020.    Objective: ROS  General: NAD EYES: denies vision changes, wears glasses, for f/u at [facility]. ENMT: denies dysphagia Cardiovascular: denies chest pain Pulmonary: denies cough, denies increased SOB Abdomen: endorses good appetite, denies constipation, endorses continence of bowel GU: denies dysuria, endorses continence of urine MSK: endorses ROM limitations, no falls reported Skin: denies rashes or wounds Neurological: endorses weakness, denies pain, denies insomnia Psych: Endorses positive mood Heme/lymph/immuno: denies bruises, abnormal bleeding  Physical Exam: Current and past weights: 125.4 lbs, gained 4 lbs Constitutional:  NAD General: frail appearing, WNWD,  EYES: anicteric sclera,lids intact, no discharge  ENMT: intact hearing,oral mucous membranes moist, dentition intact CV:  no LE edema Pulmonary:  no increased work of breathing, no cough, no audible wheezes, room air Abdomen: intake 60%,no ascites GU: deferred MSK: mod sarcopenia, decreased ROM in all extremities, no contractures of LE,  Ambulatory with stand by. Skin: warm and dry, no rashes or wounds on visible skin Neuro: Generalized weakness, + cognitive impairment Psych: non-anxious affect, A and O x  2 Hem/lymph/immuno: no widespread bruising   Thank you for the opportunity to participate in the care of Kelsey Russell.  The palliative care team will continue to follow. Please call our office at [phone] if we can be of additional assistance.  [name], NP , DNP, MPH, AGPCNP-BC, ACHPN  COVID-19 PATIENT SCREENING TOOL  Person answering questions: ____________daughter______ _____   1.  Is the patient or any family member in the home showing any signs or symptoms regarding respiratory infection?               Person with Symptom- __________NA_________________  a. Fever                                                                          Yes___ No___          ___________________  b. Shortness of breath  Yes___ No___          ___________________ c. Cough/congestion                                       Yes___  No___         ___________________ d. Body aches/pains                                                         Yes___ No___        ____________________ e. Gastrointestinal symptoms (diarrhea, nausea)           Yes___ No___        ____________________  2. Within the past 14 days, has anyone living in the home had any contact with someone with or under investigation for COVID-19?    Yes___ No_X_   Person __________________   

## 2020-06-13 ENCOUNTER — Other Ambulatory Visit: Payer: Self-pay

## 2020-06-13 ENCOUNTER — Other Ambulatory Visit: Payer: Medicare Other | Admitting: Primary Care

## 2020-06-18 ENCOUNTER — Other Ambulatory Visit: Payer: Medicare Other | Admitting: Primary Care

## 2020-06-18 ENCOUNTER — Other Ambulatory Visit: Payer: Self-pay

## 2020-06-18 DIAGNOSIS — F0390 Unspecified dementia without behavioral disturbance: Secondary | ICD-10-CM

## 2020-06-18 DIAGNOSIS — H903 Sensorineural hearing loss, bilateral: Secondary | ICD-10-CM

## 2020-06-18 DIAGNOSIS — I10 Essential (primary) hypertension: Secondary | ICD-10-CM

## 2020-06-18 NOTE — Progress Notes (Signed)
  [facility] Consult Note Telephone: [phone]  Fax: [phone]    Date of encounter: 06/18/20 PATIENT NAME: Kelsey Russell [address] [place] [address] [phone] (home)  DOB: 04/14/1922 MRN: [id number]  PRIMARY CARE PROVIDER:    Behling, Karen, MD,  [address] [place] [address] [place] [address] [phone]  REFERRING PROVIDER:   Behling, Karen, MD [address] [address] [place],  Kelsey Russell [address] [phone]  RESPONSIBLE PARTY:   Extended Emergency Contact Information Primary Emergency Contact: Russell,Kelsey Jean Mobile Phone: [phone] Relation: Daughter Secondary Emergency Contact: Russell,Kelsey Home Phone: [phone] Relation: Daughter  I met face to face with patient and family in home with Kelsey Russell. Palliative Care was asked to follow this patient by consultation request of Behling, Karen, MD to help address advance care planning and goals of care. This is a follow up visit.   ASSESSMENT AND RECOMMENDATIONS:   1. Advance Care Planning/Goals of Care: Goals include to maximize quality of life and symptom management. Our advance care planning conversation included a discussion about:     The value and importance of advance care planning   Experiences with loved ones who have been seriously ill or have died - patient is last of 12 children.  Exploration of personal, cultural or spiritual beliefs that might influence medical decisions   Exploration of goals of care in the event of a sudden injury or illness   Identification  of a healthcare agent - Daughter Kelsey Russell  Reviewed current DNR which is in home, and MOST form. No changes made today Discussed de escalation of care with Branch retinal vein occlusion of right eye with macular edema. Discussed d/c eye appts as patient is not undergoing new treatments and unable to comply with qid eye drops due to sleeping more hours of the day. She can see functionally and  can read but not retain what she's read. I encouraged her to let eye center know they plan to d/c following palliative choices.   I spent 20 minutes providing this consultation,  from 1600 to 1620. More than 50% of the time in this consultation was spent in counseling and care coordination.  -------------------------------------------------------------------------------------------------------------------------------------------------------------------------------------------------------------  2. Symptom Management:   Nutrition: Poor, eating 25-50% of food. Some days she will eat a whole bacon and egg sandwich, but some days will only have a few bites. Recommend nutritional supplements, high protein. She does like to eat a lot of candy. Daughter endorses continuing but slow decline. She feels she's eating less describes about 25% and drinks about a liter of water a day. She still enjoys her scotch each evening. Her weight is staying stable at 124 pounds. No edema.  Mobility: Mobility is declining per Kelsey Russell. She will get up at night and is able to get to the bathroom. She doesn't use a walker. She does furniture walking.   Dementia: Does have some sundowning symptoms, intermittent confusion. Has gotten confused as to where her room is, recently. Does have severe hearing loss which is contributory, but she does not like wearing her hearing aids.Patient is able to interject some comments but mostly speaks her rehearsed "three golden rules.   Caregiver strain: daughter was able to hire weekend caregivers and went to a family members over the weekend. She said this was the first time in five years she had been away. I encouraged her to keep doing this for self-care and she stated she would do it hopefully monthly. Caregiver's daughter and she herself have   both undergone cancer treatments this year, as an added strain.  3. Follow up Palliative Care Visit: Palliative care will continue to follow for  goals of care clarification and symptom management. Return 6-8 weeks or prn.  4. Family /Caregiver/Community Supports: Pt lives with his daughter Edmonia Lynch who provides 24/7 care for her  5. Cognitive / Functional decline: Pts dementia and hearing loss limit her functional level. She does require 24/7 care. She ambulates fairly well and is able to get to the bathroom unassisted.  CODE STATUS: DNR  PPS: 40%  HOSPICE ELIGIBILITY/DIAGNOSIS: TBD  Subjective:  CHIEF COMPLAINT: follow up, debility/dementia   HISTORY OF PRESENT ILLNESS:  Titiana Severa is a 87 y.o. year old female with dementia, debility and weight loss. Has been taking less po and has lost 8 lbs since June 2021. Her intake continues to be 25% of intake in healthier times. Daughter states she is drinking about 32 oz of water daily. Her intake is decreasing in context  of dementia. She is sleeping more which also impacts intake. She has had a 6% weight loss in 9 months.   We are asked to consult around advance care planning and complex medical decision making.    Review and summarization of old Epic records shows or history from other than patient. Recent eye appt/PCP appt in Epic Review or lab tests, radiology, or medicine.- Reviewed notes from eye exams, labs from PCP visit in 1/22.  History obtained from review of EMR, discussion with primary team, and  interview with family, caregiver  and/or [name]. Records reviewed and summarized above.   CURRENT PROBLEM LIST:  Patient Active Problem List   Diagnosis Date Noted  . Dementia, old age, without behavioral disturbance ([place]) 06/01/2018  . Polycythemia 05/03/2017  . Stage 3 chronic kidney disease ([place]) 04/09/2017  . CME (cystoid macular edema), right 01/06/2017  . Branch retinal vein occlusion of right eye with macular edema 05/13/2016  . HTN (hypertension) 10/04/2014  . Sensorineural hearing loss (SNHL) of both ears 10/04/2014   PAST MEDICAL HISTORY:  Active  Ambulatory Problems    Diagnosis Date Noted  . Polycythemia 05/03/2017  . Branch retinal vein occlusion of right eye with macular edema 05/13/2016  . CME (cystoid macular edema), right 01/06/2017  . Dementia, old age, without behavioral disturbance ([place]) 06/01/2018  . HTN (hypertension) 10/04/2014  . Sensorineural hearing loss (SNHL) of both ears 10/04/2014  . Stage 3 chronic kidney disease ([place]) 04/09/2017   Resolved Ambulatory Problems    Diagnosis Date Noted  . No Resolved Ambulatory Problems   No Additional Past Medical History   SOCIAL HX:  Social History   Tobacco Use  . Smoking status: Former Smoker    Quit date: 04/26/1966    Years since quitting: 54.1  . Smokeless tobacco: Never Used  Substance Use Topics  . Alcohol use: Yes    Alcohol/week: 18.0 standard drinks    Types: 18 Shots of liquor per week    Comment: 2-3 shots scotch daily   FAMILY HX:  Family History  Problem Relation Age of Onset  . Cancer Sister   . Cancer Brother   . Cancer Sister   . Cancer Son   . Cancer Brother      ALLERGIES:  Allergies  Allergen Reactions  . Levaquin [Levofloxacin In D5w]   . Statins      PERTINENT MEDICATIONS:  Outpatient Encounter Medications as of 06/18/2020  Medication Sig  . acetaminophen (TYLENOL) 500 MG tablet Take  500 mg by mouth every 6 (six) hours as needed.  . irbesartan (AVAPRO) 300 MG tablet Take 150 mg by mouth daily.   No facility-administered encounter medications on file as of 06/18/2020.    Objective: ROS  General: NAD EYES: denies vision changes, wears glasses ENMT: denies dysphagia Cardiovascular: denies chest pain Pulmonary: denies  cough, denies increased SOB Abdomen: endorses fair appetite, denies constipation, endorses continence of bowel GU: denies dysuria, endorses continence of urine.  Gets up to bathroom at night independently MSK:  endorses ROM limitations, no falls reported Skin: denies rashes or wounds Neurological: endorses  weakness, denies pain, denies insomnia Psych: Endorses positive mood Heme/lymph/immuno: denies bruises, abnormal bleeding  Physical Exam: Current and past weights: November 2021 121.4 lbs, today she is 124.2 lbs Weight stable Constitutional: NAD General: frail appearing, thin EYES: anicteric sclera, lids intact, no discharge  ENMT: very hearing impaired,oral mucous membranes moist, dentition intact CV:  no LE edema Pulmonary:  no increased work of breathing, no cough, no audible wheezes, room air Abdomen: intake 25%, no ascites GU: deferred MSK: mild sarcopenia, decreased ROM in all extremities, no contractures of LE, ambulatory with stand by, up at hs (I) to bathroom Skin: warm and dry, no rashes or wounds on visible skin Neuro: Generalized weakness,+  cognitive impairment (baseline dementia) Psych: non-anxious affect, A and O x 2 Hem/lymph/immuno: no widespread bruising   Thank you for the opportunity to participate in the care of Ms. Lansberry.  The palliative care team will continue to follow. Please call our office at [phone] if we can be of additional assistance.   McKelvey , NP , DNP, MPH, AGPCNP-BC, ACHPN   COVID-19 PATIENT SCREENING TOOL  Person answering questions: _______[name]____________   1.  Is the patient or any family member in the home showing any signs or symptoms regarding respiratory infection?                  Person with Symptom  ______________na___________ a. Fever/chills/headache                                                        Yes___ No__X_            b. Shortness of breath                                                            Yes___ No__X_           c. Cough/congestion                                               Yes___  No__X_          d. Muscle/Body aches/pains                                                   Yes___ No__X_           e. Gastrointestinal symptoms (diarrhea,nausea)             Yes___ No__X_         f. Sudden  loss of smell or taste      Yes___ No__X_        2. Within the past 10 days, has anyone living in the home had any contact with someone with or under investigation for COVID-19?    Yes___ No__X__   Person __________________

## 2020-07-23 ENCOUNTER — Other Ambulatory Visit: Payer: Medicare Other | Admitting: Primary Care

## 2020-07-23 ENCOUNTER — Other Ambulatory Visit: Payer: Self-pay

## 2020-07-23 DIAGNOSIS — F0390 Unspecified dementia without behavioral disturbance: Secondary | ICD-10-CM

## 2020-07-23 DIAGNOSIS — H903 Sensorineural hearing loss, bilateral: Secondary | ICD-10-CM

## 2020-07-23 DIAGNOSIS — Z515 Encounter for palliative care: Secondary | ICD-10-CM

## 2020-07-23 NOTE — Progress Notes (Signed)
[facility] Consult Note Telephone: [phone]  Fax: [phone]    Date of encounter: 07/23/20 PATIENT NAME: Kelsey Russell [address] [place] [address]   [phone] (home)  DOB: 1922-09-17 MRN: [id number] PRIMARY CARE PROVIDER:    Barbaraann Boys, MD,  [place] [address] [phone]  REFERRING PROVIDER:   Barbaraann Boys, [address] [place],  [place] [address] [phone]  RESPONSIBLE PARTY:    Contact Information    Name Relation Home Work Mobile   Wright Daughter   [phone]   [facility] Daughter [phone]       I met face to face with patient and family in  home. Palliative Care was asked to follow this patient by consultation request of  Barbaraann Boys, MD to address advance care planning and complex medical decision making. This is a follow up visit.                                   ASSESSMENT AND PLAN / RECOMMENDATIONS:   Advance Care Planning/Goals of Care: Goals include to maximize quality of life and symptom management. Our advance care planning conversation included a discussion about:     The value and importance of advance care planning   Experiences with loved ones who have been seriously ill or have died   Exploration of personal, cultural or spiritual beliefs that might influence medical decisions   Exploration of goals of care in the event of a sudden injury or illness   Review of an  advance directive document- no changes, comfort measures.  Decision  de-escalate disease focused treatments due to poor prognosis.  CODE STATUS: DNR  Daughter wanted to talk about what would happen if she were to pass or what signs she would see if death was Administrator, arts. We discussed some of the physical as well as psycho /social/ spiritual symptoms that she may see. Discussed current documents, no changes. DNR Posted.  She thought maybe her mother was not feeling  she could leave her. We discussed that her father and husband's death and what those looked like. Daughter voiced concerns as she has had her own health issues with cancer and her daughter has been sick with cancer. She has been getting some hired help and has means to do so so I encouraged her to hire the help that she felt would be needed for her and for her mother. We discussed eligibility for hospice and when that would be appropriate.   I spent 20 minutes providing this consultation. More than 50% of the time in this consultation was spent in counseling and care coordination.  ___________________________________________________________________________  Symptom Management/Plans  Fall risk/ immobility: Order for physical therapy and occupational therapy to assess for safety, DME needs, and to teach caregiver transfer techniques. I have reached out to see if we can get a home health agency involved with them as they have never had one before and do some of these caregiving teaching interventions.   Order for DME: Hospital bed due to need to have assistance in transfers for safety. Medically necessary for position change and safety. Walker: To stabilize gait and fall prevention;Currently ambulates by grabbing furniture. 3 in 1 toilet for bedside or to elevate over toilet as toilet is too low and pt cannot get off toilet without assistance.   Fall: Had a fall this week going  to bath room. Not sure how she fell but she was on floor of bathroom and EMS came and assessed. No reason to go to ED but daughter is concerned RE needing more hs care.   Loose Stools: No apparent culprit, recommend several doses of immodium and starting pro biotics. She has had some incontinence issues.  Nutrition :She is eating at her baseline and actually has gained some weight consistently over the past six months.   Somnolence: Daughter reports she is sleeping sometimes to 430 pm from night before. We discussed  stimulation and day time activities. She does eat and then goes back to sleep.   Frailty: Continues to make decline. Today she is less animated and interactive, but did just have a big group of company for Easter. Daughter was concerned about a fall and patient was examined by EMS. There were no lasting problems. However daughter is concerned about debility and progressive loss of function. We discussed the fact that she can no longer ambulate independently at night to the bathroom which will increase her needs for a caregiver to sit with her at night. She's also becoming unable to toilet herself and that is a concern to family. Patient is generally at her baseline although not as animated as usual. They have just had company in the house and daughter feels she might be tired. She is eating at her baseline and actually has gained some weight consistently over the past six months.   Follow up Palliative Care Visit: Palliative care will continue to follow for complex medical decision making, advance care planning, and clarification of goals. Return 6 weeks or prn.  This visit was coded based on medical decision making (MDM)  PPS: 30%  HOSPICE ELIGIBILITY/DIAGNOSIS: TBD  Chief Complaint: fall, immobility  HISTORY OF PRESENT ILLNESS:  Kelsey Russell is a 87 y.o. year old female  with dementia, immobility, fall recent. Fall happened unwitnessed in bathroom over weekend, family heard her calling and came to her aid. Ems attended and determined no need to transport. Daughter endorses worsening stability and needing more help with transfers. They do not have proper equipment for safe transfers. Daughter requests Home health to help with this assessment. Also discussed her debility and immobility in context of worsening dementia. Daughter will also be getting hs care givers for safety and fall prevention .   History obtained from review of EMR, discussion with primary team, and interview with family,  facility staff/caregiver and/or Ms. Semple.  I reviewed available labs, medications, imaging, studies and related documents from the EMR.  Records reviewed and summarized above.   ROS/ family report  General: NAD EYES: denies vision changes, recent exam ENMT: denies dysphagia Cardiovascular: denies chest pain, denies DOE Pulmonary: denies cough, denies increased SOB Abdomen: endorses fair appetite, denies constipation, endorses incontinence of bowel GU: denies dysuria, endorses  incontinence of urine MSK:  endorses weakness,  Recent  fall reported Skin: denies rashes or wounds Neurological: denies pain, denies insomnia Psych: Endorses positive mood  Physical Exam: Current and past weights: 126.6 today, stable/ slight gain Constitutional: NAD General: frail appearing, WNWD EYES: anicteric sclera, lids intact, no discharge  ENMT: intact hearing, oral mucous membranes moist, dentition intact CV:  RRR, no LE edema Pulmonary:  no increased work of breathing, no cough, room air Abdomen: intake 25-50%, no ascites MSK: mod sarcopenia, moves all extremities, ambulatory with assistance Skin: warm and dry, no rashes or wounds on visible skin Neuro:  ++ generalized weakness,  Severe cognitive  impairment Psych: non-anxious affect, A and O x 1 Hem/lymph/immuno: no widespread bruising  Thank you for the opportunity to participate in the care of Ms. Rhee.  The palliative care team will continue to follow. Please call our office at [phone] if we can be of additional assistance.   [name], NP , DNP, MPH, AGPCNP-BC, ACHPN  COVID-19 PATIENT SCREENING TOOL Asked and negative response unless otherwise noted:   Have you had symptoms of covid, tested positive or been in contact with someone with symptoms/positive test in the past 5-10 days?

## 2020-07-24 ENCOUNTER — Telehealth: Payer: Self-pay | Admitting: Primary Care

## 2020-07-24 NOTE — Telephone Encounter (Signed)
T/c from daughter that pt has fallen again. This time EMS is transporting to  [facility] ED she believes due to heart block. She wants comfort oriented goals and is ready for hospice admission if patient cannot be conservatively treated. She has requested [facility] hospice if needed.

## 2020-07-25 ENCOUNTER — Telehealth: Payer: Self-pay | Admitting: Primary Care

## 2020-07-25 NOTE — Telephone Encounter (Signed)
T/c from daughter RE ED trip. Pt has CT of head and U/a per cath, all normal. She was in ED 9 hours, and family does NOT want ED transport again. MOST was in place outlining this.  I have asked home health to get involved. Also reports her mother has not urinated since 3 am. I have advised a few maneuvers to help urination but if she does not urinate this PM, she may need cath in the am, but had no mechanism other than ED.

## 2020-07-26 ENCOUNTER — Telehealth: Payer: Self-pay | Admitting: Primary Care

## 2020-07-26 NOTE — Telephone Encounter (Signed)
Patient fell several days ago, the day after my visit with her, and was taken to [facility] ED. She was evaluated for head trauma and UTI, both negative. Daughter called to state patient has ongoing pain and discomfort from the fall. Goals of care are for comfort and de escalation of medical interventions. I will escribe tramadol 50 mg give 1/2 tablet by mouth every 6 hours as needed for pain # 15, for her to use for 7-10 days. If pain persists she should follow up with PCP. We have also been discussing hospice admission once appropriate.  I have advised daughter to monitor patient and now allow her to ambulate without supervision while using this medication.

## 2020-07-28 ENCOUNTER — Telehealth: Payer: Self-pay | Admitting: Primary Care

## 2020-07-28 NOTE — Telephone Encounter (Signed)
T/c to family as pt  Is in ED. We'd discussed hospice admission. Family states they will talk to ED staff. Labs and imaging to inform hospice diagnosis pending.

## 2020-08-22 ENCOUNTER — Other Ambulatory Visit: Payer: Medicare Other | Admitting: Primary Care

## 2020-08-26 ENCOUNTER — Telehealth: Payer: Self-pay | Admitting: Primary Care

## 2020-08-26 ENCOUNTER — Encounter: Payer: Self-pay | Admitting: Primary Care

## 2020-08-26 NOTE — Telephone Encounter (Signed)
Patient deceased on 14-Aug-2020.

## 2020-09-04 DEATH — deceased

## 2021-08-07 IMAGING — CR DG CHEST 2V
1 series · 2 of 2 positions shown · non-contrast
Comparison: October 11, 2018

CLINICAL DATA: Abnormal chest sounds.

EXAM:
CHEST - 2 VIEW

[Series 1: dg chest 2 view · 0.14mm/px · 2 of 2 slices shown]
[im 1/2]
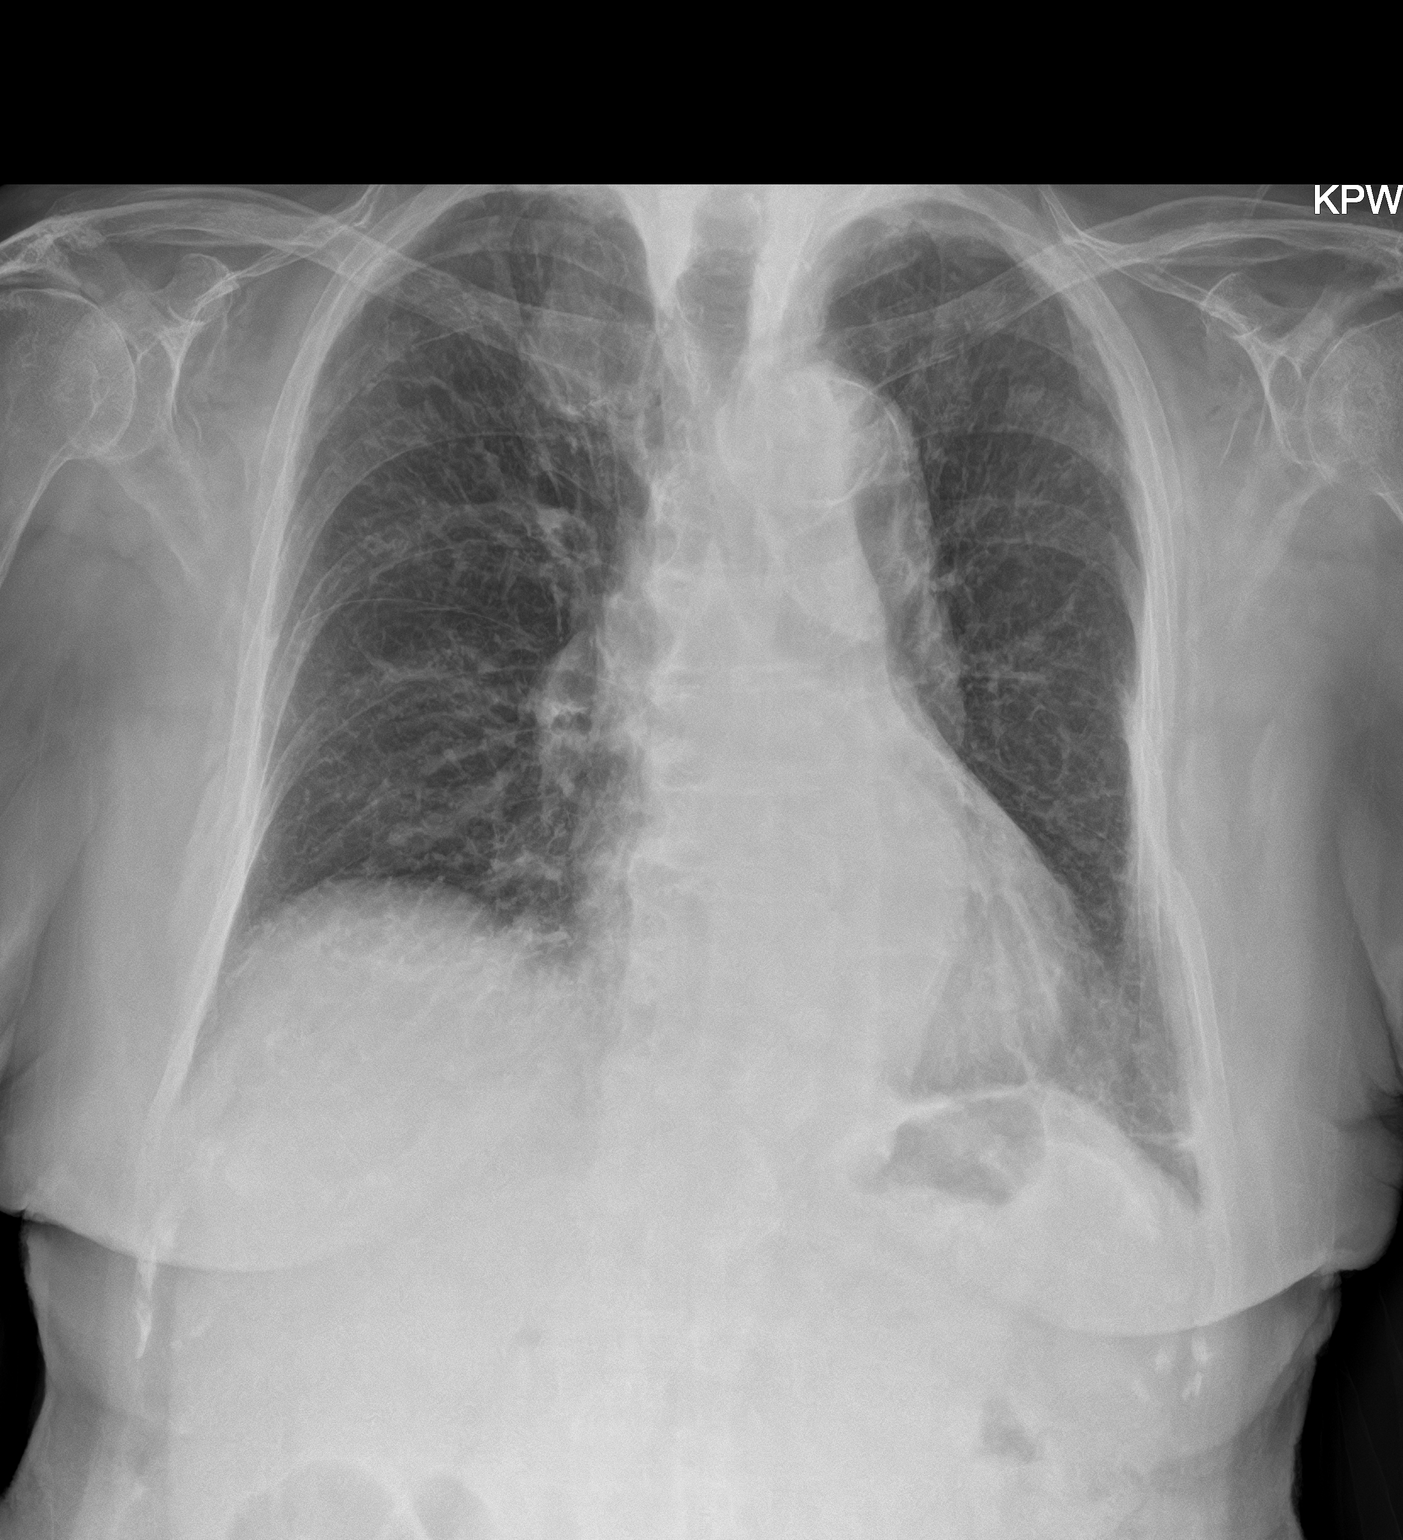
[im 2/2]
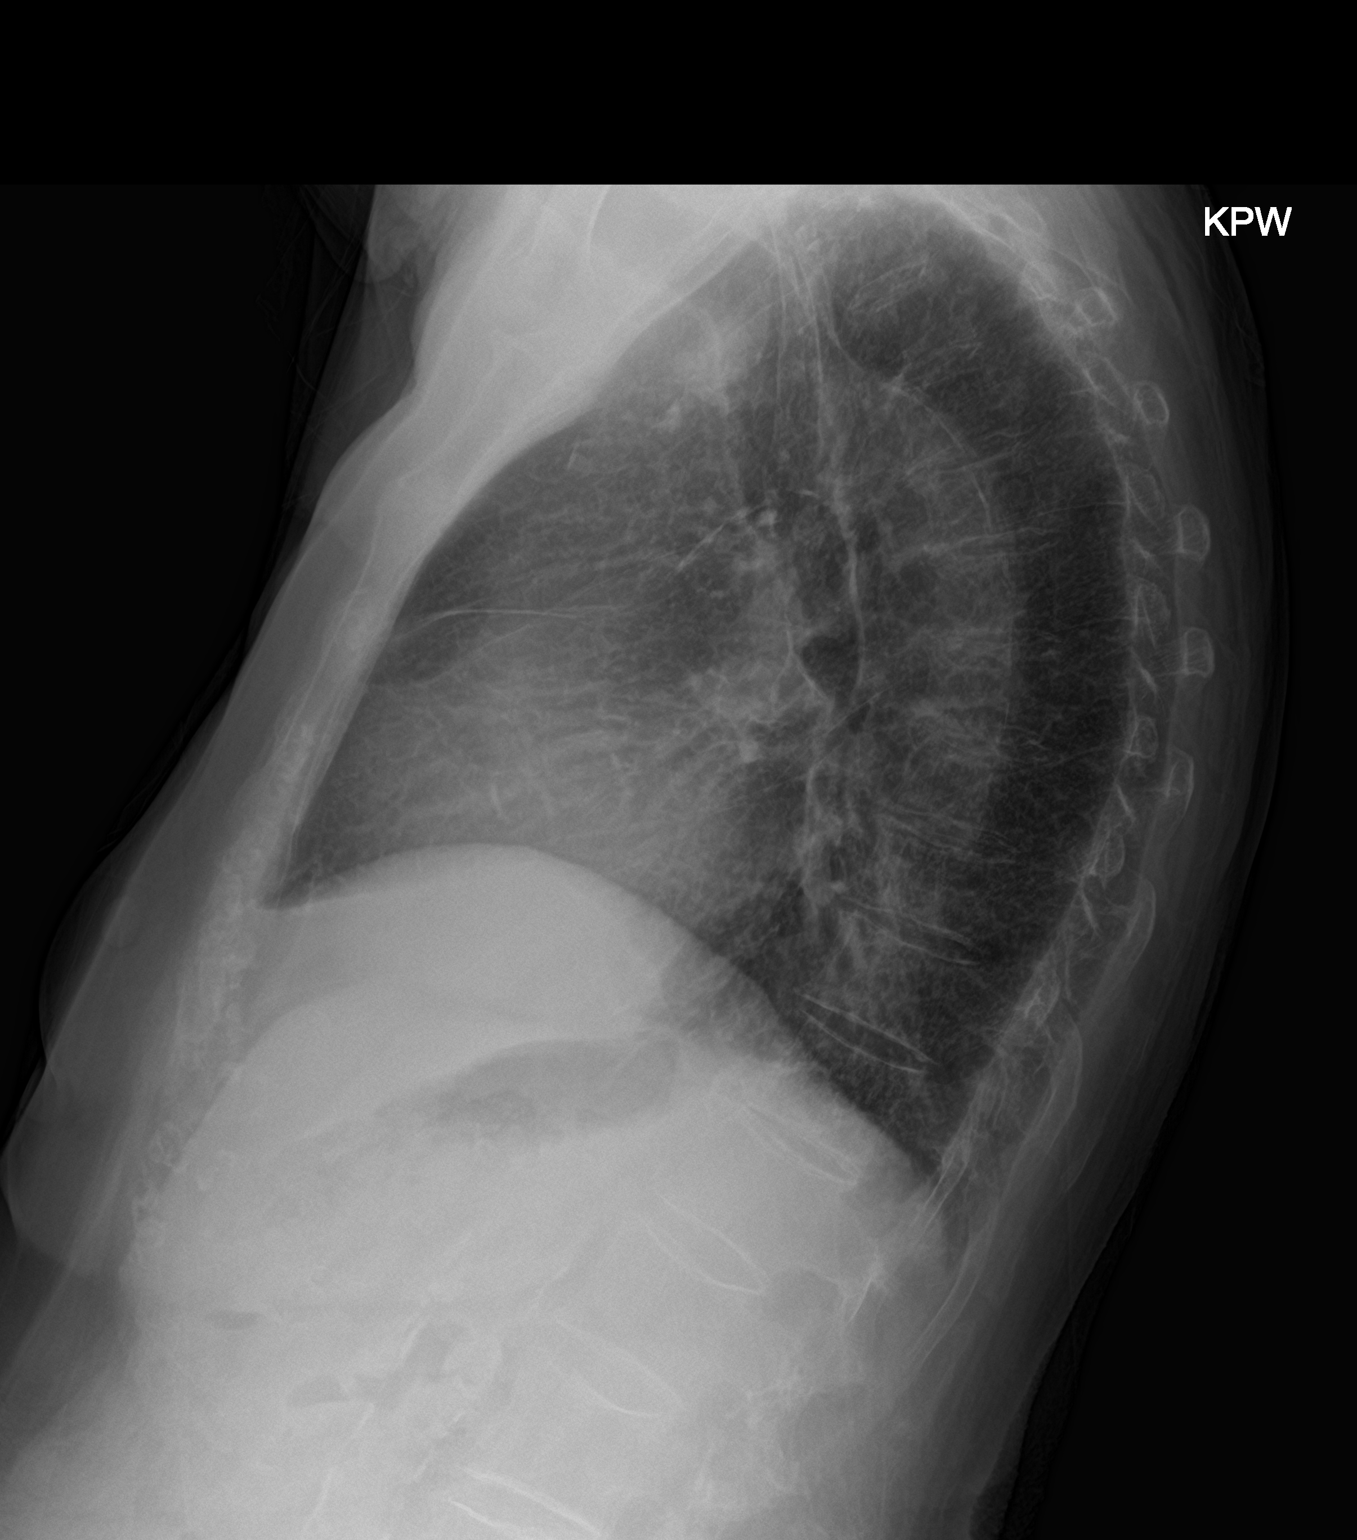

[2 of 2 positions shown; findings below may reference images not displayed]

FINDINGS: Moderate severity diffuse chronic appearing increased lung markings
are seen with a trace amount of linear atelectasis noted along the
lateral aspect of the left lung base.

There is no evidence of a pleural effusion or pneumothorax.

The heart size and mediastinal contours are within normal limits.

There is marked severity calcification of the aortic arch.

Multilevel degenerative changes seen throughout the thoracic spine.
IMPRESSION: Chronic appearing increased lung markings with trace amount of left
basilar linear atelectasis.
